# Patient Record
Sex: Female | Born: 1955 | Race: White | Hispanic: No | Smoking: Former smoker
Health system: Southern US, Community
[De-identification: ages and names within clinical notes are randomized; demographics above are authoritative.]

## PROBLEM LIST (undated history)

## (undated) DIAGNOSIS — I639 Cerebral infarction, unspecified: Secondary | ICD-10-CM

## (undated) HISTORY — DX: Cerebral infarction, unspecified: I63.9

## (undated) HISTORY — PX: BRAIN SURGERY: SHX531

---

## 1998-07-02 ENCOUNTER — Other Ambulatory Visit: Admission: RE | Admit: 1998-07-02 | Discharge: 1998-07-02 | Payer: Self-pay | Admitting: Gynecology

## 1999-08-18 ENCOUNTER — Other Ambulatory Visit: Admission: RE | Admit: 1999-08-18 | Discharge: 1999-08-18 | Payer: Self-pay | Admitting: Gynecology

## 1999-12-23 ENCOUNTER — Other Ambulatory Visit: Admission: RE | Admit: 1999-12-23 | Discharge: 1999-12-23 | Payer: Self-pay | Admitting: Gynecology

## 2000-08-31 ENCOUNTER — Other Ambulatory Visit: Admission: RE | Admit: 2000-08-31 | Discharge: 2000-08-31 | Payer: Self-pay | Admitting: Gynecology

## 2001-04-08 ENCOUNTER — Emergency Department (HOSPITAL_COMMUNITY): Admission: EM | Admit: 2001-04-08 | Discharge: 2001-04-08 | Payer: Self-pay | Admitting: Emergency Medicine

## 2001-09-06 ENCOUNTER — Other Ambulatory Visit: Admission: RE | Admit: 2001-09-06 | Discharge: 2001-09-06 | Payer: Self-pay | Admitting: Gynecology

## 2004-01-29 ENCOUNTER — Other Ambulatory Visit: Admission: RE | Admit: 2004-01-29 | Discharge: 2004-01-29 | Payer: Self-pay | Admitting: Gynecology

## 2005-05-14 ENCOUNTER — Other Ambulatory Visit: Admission: RE | Admit: 2005-05-14 | Discharge: 2005-05-14 | Payer: Self-pay | Admitting: Gynecology

## 2006-05-13 ENCOUNTER — Other Ambulatory Visit: Admission: RE | Admit: 2006-05-13 | Discharge: 2006-05-13 | Payer: Self-pay | Admitting: Gynecology

## 2012-08-24 ENCOUNTER — Other Ambulatory Visit: Payer: Self-pay | Admitting: Internal Medicine

## 2012-08-24 ENCOUNTER — Ambulatory Visit
Admission: RE | Admit: 2012-08-24 | Discharge: 2012-08-24 | Disposition: A | Discharge status: Home / Self Care | Payer: Medicare PPO | Source: Ambulatory Visit | Attending: Internal Medicine | Admitting: Internal Medicine

## 2012-08-24 DIAGNOSIS — W19XXXA Unspecified fall, initial encounter: Secondary | ICD-10-CM

## 2013-08-28 ENCOUNTER — Other Ambulatory Visit: Payer: Self-pay | Admitting: Internal Medicine

## 2013-08-28 ENCOUNTER — Ambulatory Visit
Admission: RE | Admit: 2013-08-28 | Discharge: 2013-08-28 | Disposition: A | Discharge status: Home / Self Care | Payer: Medicare PPO | Source: Ambulatory Visit | Attending: Internal Medicine | Admitting: Internal Medicine

## 2013-08-28 DIAGNOSIS — W19XXXA Unspecified fall, initial encounter: Secondary | ICD-10-CM

## 2014-10-02 ENCOUNTER — Ambulatory Visit
Admission: RE | Admit: 2014-10-02 | Discharge: 2014-10-02 | Disposition: A | Discharge status: Home / Self Care | Payer: Medicare PPO | Source: Ambulatory Visit | Attending: Internal Medicine | Admitting: Internal Medicine

## 2014-10-02 ENCOUNTER — Other Ambulatory Visit: Payer: Self-pay | Admitting: Internal Medicine

## 2014-10-02 DIAGNOSIS — R059 Cough, unspecified: Secondary | ICD-10-CM

## 2014-10-02 DIAGNOSIS — R05 Cough: Secondary | ICD-10-CM

## 2020-01-13 ENCOUNTER — Emergency Department (HOSPITAL_COMMUNITY): Payer: Medicare PPO

## 2020-01-13 ENCOUNTER — Emergency Department (HOSPITAL_COMMUNITY)
Admission: EM | Admit: 2020-01-13 | Discharge: 2020-01-13 | Disposition: A | Discharge status: Home / Self Care | Payer: Medicare PPO | Attending: Emergency Medicine | Admitting: Emergency Medicine

## 2020-01-13 ENCOUNTER — Other Ambulatory Visit: Payer: Self-pay

## 2020-01-13 DIAGNOSIS — S3210XA Unspecified fracture of sacrum, initial encounter for closed fracture: Secondary | ICD-10-CM | POA: Insufficient documentation

## 2020-01-13 DIAGNOSIS — Y92002 Bathroom of unspecified non-institutional (private) residence single-family (private) house as the place of occurrence of the external cause: Secondary | ICD-10-CM | POA: Diagnosis not present

## 2020-01-13 DIAGNOSIS — W19XXXA Unspecified fall, initial encounter: Secondary | ICD-10-CM

## 2020-01-13 DIAGNOSIS — Y999 Unspecified external cause status: Secondary | ICD-10-CM | POA: Insufficient documentation

## 2020-01-13 DIAGNOSIS — Y9389 Activity, other specified: Secondary | ICD-10-CM | POA: Diagnosis not present

## 2020-01-13 DIAGNOSIS — S32592A Other specified fracture of left pubis, initial encounter for closed fracture: Secondary | ICD-10-CM

## 2020-01-13 DIAGNOSIS — W010XXA Fall on same level from slipping, tripping and stumbling without subsequent striking against object, initial encounter: Secondary | ICD-10-CM | POA: Diagnosis not present

## 2020-01-13 DIAGNOSIS — I10 Essential (primary) hypertension: Secondary | ICD-10-CM | POA: Insufficient documentation

## 2020-01-13 DIAGNOSIS — S32502A Unspecified fracture of left pubis, initial encounter for closed fracture: Secondary | ICD-10-CM | POA: Insufficient documentation

## 2020-01-13 DIAGNOSIS — S79912A Unspecified injury of left hip, initial encounter: Secondary | ICD-10-CM | POA: Diagnosis present

## 2020-01-13 MED ORDER — OXYCODONE-ACETAMINOPHEN 5-325 MG PO TABS
1.0000 | ORAL_TABLET | Freq: Once | ORAL | Status: AC
  Administered 2020-01-13: 1 via ORAL
  Filled 2020-01-13: qty 1

## 2020-01-13 NOTE — ED Provider Notes (Signed)
[facility]-EMERGENCY DEPT Provider Note   CSN: [id number] Arrival date & time: 01/13/20  1230     History Chief Complaint  Patient presents with  . left hip injury    Samantha Duncan is a 64 y.o. female history of brain aneurysm 1989, residual left-sided weakness, hypertension, hyperlipidemia presents today after fall that occurred on Wednesday.  Patient was taking a shower when she slipped falling towards her right side.  She did not lose consciousness denies any head injury.  She reports that she had left hip pain since the fall despite falling onto her right side.  She was able to crawl out of the bathroom into her bed, she reports she was on the ground for around 10 minutes before she was able to pull herself up into bed.  She describes moderate-severe left hip and left mid thigh pain since the fall, constant throbbing nonradiating worsened with palpation improved with rest.  She has been taking hydrocodone with some relief of symptoms.  Denies head injury, loss consciousness, blood thinner use, neck pain, back pain, chest pain, abdominal pain, nausea/vomiting, pain to the other 3 extremities, dysuria/hematuria, recent illness, fever/chills or any additional concerns.  HPI     No past medical history on file.  There are no problems to display for this patient.      OB History   No obstetric history on file.     No family history on file.  Social History   Tobacco Use  . Smoking status: Not on file  Substance Use Topics  . Alcohol use: Not on file  . Drug use: Not on file    Home Medications Prior to Admission medications   Not on File    Allergies    Sulfa antibiotics  Review of Systems   Review of Systems Ten systems are reviewed and are negative for acute change except as noted in the HPI  Physical Exam Updated Vital Signs BP (!) 154/78 (BP Location: Left Arm)   Pulse (!) 103   Temp 98.5 F (36.9 C) (Oral)   Resp 17   Ht 5\' 1"   (1.549 m)   Wt 49.9 kg   SpO2 95%   BMI 20.78 kg/m   Physical Exam Constitutional:      General: She is not in acute distress.    Appearance: Normal appearance. She is well-developed. She is not ill-appearing or diaphoretic.  HENT:     Head: Normocephalic and atraumatic.  Eyes:     General: Vision grossly intact. Gaze aligned appropriately.     Pupils: Pupils are equal, round, and reactive to light.  Neck:     Trachea: Trachea and phonation normal.  Pulmonary:     Effort: Pulmonary effort is normal. No respiratory distress.  Abdominal:     General: There is no distension.     Palpations: Abdomen is soft.     Tenderness: There is no abdominal tenderness. There is no guarding or rebound.  Musculoskeletal:        General: Normal range of motion.     Cervical back: Normal range of motion.     Comments: No midline C/T/L spinal tenderness to palpation, no paraspinal muscle tenderness, no deformity, crepitus, or step-off noted. No sign of injury to the neck or back. - Left hip and upper thigh tenderness without overlying skin change.  All other major joints mobilized with verbal range of motion and strength for age and disability.  Skin:    General: Skin  is warm and dry.  Neurological:     Mental Status: She is alert.     GCS: GCS eye subscore is 4. GCS verbal subscore is 5. GCS motor subscore is 6.     Comments: Speech is clear and goal oriented, follows commands Major Cranial nerves without deficit, no facial droop Moves extremities without ataxia, coordination intact  Psychiatric:        Behavior: Behavior normal.     ED Results / Procedures / Treatments   Labs (all labs ordered are listed, but only abnormal results are displayed) Labs Reviewed - No data to display  EKG None  Radiology CT Hip Left Wo Contrast  Result Date: 01/13/2020 CLINICAL DATA:  Left hip pain since a fall 01/10/2020. Possible fracture on plain film of the left hip today. Initial encounter. EXAM:  CT OF THE LEFT HIP WITHOUT CONTRAST TECHNIQUE: Multidetector CT imaging of the left hip was performed according to the standard protocol. Multiplanar CT image reconstructions were also generated. COMPARISON:  Plain films left hip earlier today. FINDINGS: Bones/Joint/Cartilage The left hip is located. No hip fracture is identified. Nondisplaced left sacral fracture is identified. Also seen is a nondisplaced fracture of the left inferior pubic ramus. There is also likely a nondisplaced fracture of the high left superior pubic ramus although it is difficult to visualize. Minimal degenerative change is seen about the left hip. Ligaments Suboptimally assessed by CT. Muscles and Tendons Intact and normal in appearance. Soft tissues Imaged intrapelvic contents demonstrate no acute or focal abnormality. Extensive atherosclerosis noted. IMPRESSION: Negative for hip fracture. Nondisplaced left sacral and pubic rami fractures. Aortic Atherosclerosis (ICD10-I70.0). Electronically Signed   By: Drusilla Kanner M.D.   On: 01/13/2020 14:56   DG HIP UNILAT W OR W/O PELVIS 2-3 VIEWS LEFT  Result Date: 01/13/2020 CLINICAL DATA:  Fall on Wednesday D in the shower. The LEFT hip pain started yesterday. EXAM: DG HIP (WITH OR WITHOUT PELVIS) 2-3V LEFT COMPARISON:  Femur of the same date. FINDINGS: Osteopenia. Subtle abnormality at the margin of the LEFT femoral head seen only on the AP view which is in nonstandard positioning. There may be cortical step-off in this location. No additional signs of fracture. There is some nonstandard positioning on AP view perhaps related to neuro muscular limitations. IMPRESSION: Question subtle subcapital fracture on the LEFT. Suggest CT or MRI for further evaluation in this osteopenic patient. Electronically Signed   By: Donzetta Kohut M.D.   On: 01/13/2020 14:02   DG Femur 1 View Left  Result Date: 01/13/2020 CLINICAL DATA:  Fall in shower.  Left hip pain EXAM: LEFT FEMUR 1 VIEW COMPARISON:   None. FINDINGS: A single view was specifically requested as there is a dedicated hip series performed at the same time and reported separately. No visible femur fracture and no hip or knee dislocation in the frontal view. Osteopenic appearance IMPRESSION: 1. Single view left femur without acute finding. Please reference dedicated hip series. 2. Osteopenia. Electronically Signed   By: Marnee Spring M.D.   On: 01/13/2020 14:11    Procedures Procedures (including critical care time)  Medications Ordered in ED Medications  oxyCODONE-acetaminophen (PERCOCET/ROXICET) 5-325 MG per tablet 1 tablet (1 tablet Oral Given 01/13/20 1414)    ED Course  I have reviewed the triage vital signs and the nursing notes.  Pertinent labs & imaging results that were available during my care of the patient were reviewed by me and considered in my medical decision making (see chart for  details).    MDM Rules/Calculators/A&P                          Additional history obtained from: 1. Nursing notes from this visit. 2. Roommate, Ms. Cory Roughen. ------------------------------------ DG Pelvis w/ Left Hip:  IMPRESSION:  Question subtle subcapital fracture on the LEFT. Suggest CT or MRI  for further evaluation in this osteopenic patient.   DG Left Femur:  IMPRESSION:  1. Single view left femur without acute finding. Please reference  dedicated hip series.  2. Osteopenia.   CT Left Hip:  IMPRESSION:  Negative for hip fracture.    Nondisplaced left sacral and pubic rami fractures.    Aortic Atherosclerosis (ICD10-I70.0).  - Patient well-appearing no acute distress pleasant.  No evidence of head injury, neck injury, chest abdomen or back injury.  Pelvis is stable to compression bilaterally.  She has some pain with movement at the left hip.  Minimal pain at the left upper thigh suspect secondary to nondisplaced fracture seen on CT scan today.  No pain at the knee or remainder of the leg.  She is  neurovascular intact distally strong and equal pedal pulses, good capillary refill and sensation.  Patient has no evidence of injury to the other 3 extremities.  Her left-sided deficit is baseline since 1989 per patient.  She is fully alert and oriented and is not on any blood thinners.  Patient requested that her roommate and caregiver Lanney Gins be involved in discussion, shared decision making was made.  Patient and caregiver are both requesting discharge today, Ms. Cory Roughen will be able to help care for the patient at home and assist with transfers as patient cannot use a walker due to her left upper extremity paralysis.  They have asked that home health referral be given to assist during daytime hours when Ms. Cory Roughen may be unavailable.  They have been given referral to orthopedist for further evaluation and management.  No indication for admission or further work-up at this time.  Patient has hydrocodone she takes by her PCP, will not prescribe additional opioids at this time, patient will use OTC anti-inflammatories and rice therapy at home.  Patient denies any other concerns, she reports that she has been well taken care of by Ms. Cory Roughen at home, no decreased p.o. intake or infectious symptoms, suspect mild tachycardia on arrival was secondary to pain, improved with Percocet.  At this time there does not appear to be any evidence of an acute emergency medical condition and the patient appears stable for discharge with appropriate outpatient follow up. Diagnosis was discussed with patient who verbalizes understanding of care plan and is agreeable to discharge. I have discussed return precautions with patient and Ms Cory Roughen who verbalizes understanding. Patient encouraged to follow-up with their PCP and orthopedist. All questions answered.  Patient's case and imaging discussed with Dr. Denton Lank who agrees with plan to discharge with outpatient follow-up.   Note: Portions of this report may have been  transcribed using voice recognition software. Every effort was made to ensure accuracy; however, inadvertent computerized transcription errors may still be present. Final Clinical Impression(s) / ED Diagnoses Final diagnoses:  Fall, initial encounter  Closed fracture of sacrum, unspecified portion of sacrum, initial encounter (HCC)  Closed fracture of multiple pubic rami, left, initial encounter [employer])    Rx / DC Orders ED Discharge Orders         Ordered    Home Health  01/13/20 1546    Face-to-face encounter (required for Medicare/Medicaid patients)       Comments: I Bill Salinas certify that this patient is under my care and that I, or a nurse practitioner or physician's assistant working with me, had a face-to-face encounter that meets the physician face-to-face encounter requirements with this patient on 01/13/2020. The encounter with the patient was in whole, or in part for the following medical condition(s) which is the primary reason for home health care (List medical condition): Residual left-sided weakness from previous stroke.  Now with left rami and left sacral nondisplaced fractures.  Difficulty getting around at home.  Patient is roommate helps with care at night but patient needs help during the day with ADLs.   01/13/20 1546           Bill Salinas, PA-C 01/13/20 1558    Cathren Laine, MD 01/13/20 [phone]

## 2020-01-13 NOTE — ED Triage Notes (Signed)
Patient had fall on Wednesday in shower. Was able to walk after fall. Patient says hip now hurts 10/10 - unable to walk. Went to [facility] and was sent here for xrays.

## 2020-01-13 NOTE — Discharge Instructions (Addendum)
At this time there does not appear to be the presence of an emergent medical condition, however there is always the potential for conditions to change. Please read and follow the below instructions.  Please return to the Emergency Department immediately for any new or worsening symptoms. Please be sure to follow up with your Primary Care Provider within one week regarding your visit today; please call their office to schedule an appointment even if you are feeling better for a follow-up visit. Please call the orthopedic specialist Dr. Everardo Pacific on your discharge paperwork to schedule follow-up appointment regarding your fractures.  Please drink plenty of water and get plenty of rest.  In addition to your home hydrocodone you may take OTC anti-inflammatories such as Tylenol and ibuprofen to help with your pain.  Get help right away if you: Feel light-headed or faint. Develop chest pain. Develop shortness of breath. Have a fever. Have blood in your urine or your stools. Have bleeding in your vagina. Have difficulty or pain with urination or with passing stool. Have difficulty or increased pain with walking. Have new or increased swelling in one of your legs. Have numbness in your legs or groin area. You have any new/concerning or worsening of symptoms  Please read the additional information packets attached to your discharge summary.  Do not take your medicine if  develop an itchy rash, swelling in your mouth or lips, or difficulty breathing; call 911 and seek immediate emergency medical attention if this occurs.  You may review your lab tests and imaging results in their entirety on your MyChart account.  Please discuss all results of fully with your primary care provider and other specialist at your follow-up visit.  Note: Portions of this text may have been transcribed using voice recognition software. Every effort was made to ensure accuracy; however, inadvertent computerized transcription  errors may still be present.

## 2020-01-16 ENCOUNTER — Telehealth: Payer: Self-pay

## 2020-01-16 NOTE — Telephone Encounter (Signed)
Received a message from Ms Samantha Duncan that Samantha Duncan was attempting to get a hold of CM about his mother. She was a [facility] this past Saturday, and there were orders for Home health, but he was not been contacted about them. In looking through the chart, no CM or CSW notes were  In the chart.indicating that the consult had been seen. The son had initially wanted to have his mother placed in SNF, which this CM told him he would have to present her back to the ED, and then  [facility] and tlaked through process nmore and wants to try home ehalth. I spke directly to Dr. Su Hilt, who stated he did not think that home health would meet her needs, but was m=willing to sign home ehalth orders. Is socnerned about ramus fx with PT, but is worth having them evaluate her. dHe verbally stated that she needed PT, OT, Aide and social work. Called [facility] for services. And [facility] did accept although admitted struggles with Dr Su Hilt signing orders for [facility]. I assured him that MD stated he would sign orders.

## 2020-01-18 ENCOUNTER — Encounter (HOSPITAL_COMMUNITY): Payer: Self-pay | Admitting: Emergency Medicine

## 2020-01-18 DIAGNOSIS — R2689 Other abnormalities of gait and mobility: Secondary | ICD-10-CM | POA: Diagnosis not present

## 2020-01-18 DIAGNOSIS — Y999 Unspecified external cause status: Secondary | ICD-10-CM | POA: Insufficient documentation

## 2020-01-18 DIAGNOSIS — S79919A Unspecified injury of unspecified hip, initial encounter: Secondary | ICD-10-CM | POA: Diagnosis present

## 2020-01-18 DIAGNOSIS — Y9289 Other specified places as the place of occurrence of the external cause: Secondary | ICD-10-CM | POA: Diagnosis not present

## 2020-01-18 DIAGNOSIS — Y9389 Activity, other specified: Secondary | ICD-10-CM | POA: Diagnosis not present

## 2020-01-18 DIAGNOSIS — Z20822 Contact with and (suspected) exposure to covid-19: Secondary | ICD-10-CM | POA: Diagnosis not present

## 2020-01-18 DIAGNOSIS — W19XXXA Unspecified fall, initial encounter: Secondary | ICD-10-CM | POA: Insufficient documentation

## 2020-01-18 DIAGNOSIS — S79912A Unspecified injury of left hip, initial encounter: Secondary | ICD-10-CM | POA: Diagnosis not present

## 2020-01-18 DIAGNOSIS — R2681 Unsteadiness on feet: Secondary | ICD-10-CM | POA: Insufficient documentation

## 2020-01-18 DIAGNOSIS — M6281 Muscle weakness (generalized): Secondary | ICD-10-CM | POA: Diagnosis not present

## 2020-01-18 NOTE — ED Triage Notes (Signed)
Pt had a fall on 8/18 and was seen in the ED on 8/21. Pt states the pain from her fall has not resolved. C/o pelvic pain, foot pain, and left hip pain, and left ankle pain. Pt states she is unable to function on her own at home and feels she needs inpatient rehab. Family has been speaking with a case worker and they recommended her be seen in the ED for reevaluation. Pt currently taking home pain meds with so relief. Denies other complaints.

## 2020-01-19 ENCOUNTER — Emergency Department (HOSPITAL_COMMUNITY): Payer: Medicare PPO

## 2020-01-19 ENCOUNTER — Emergency Department (HOSPITAL_BASED_OUTPATIENT_CLINIC_OR_DEPARTMENT_OTHER): Payer: Medicare PPO

## 2020-01-19 ENCOUNTER — Emergency Department (HOSPITAL_COMMUNITY)
Admission: EM | Admit: 2020-01-19 | Discharge: 2020-01-22 | Disposition: A | Discharge status: Home / Self Care | Payer: Medicare PPO | Attending: Emergency Medicine | Admitting: Emergency Medicine

## 2020-01-19 DIAGNOSIS — R609 Edema, unspecified: Secondary | ICD-10-CM | POA: Diagnosis not present

## 2020-01-19 DIAGNOSIS — W19XXXA Unspecified fall, initial encounter: Secondary | ICD-10-CM

## 2020-01-19 LAB — COMPREHENSIVE METABOLIC PANEL
ALT: 19 U/L (ref 0–44)
AST: 18 U/L (ref 15–41)
Albumin: 3.9 g/dL (ref 3.5–5.0)
Alkaline Phosphatase: 66 U/L (ref 38–126)
Anion gap: 11 (ref 5–15)
BUN: 23 mg/dL (ref 8–23)
CO2: 25 mmol/L (ref 22–32)
Calcium: 9.1 mg/dL (ref 8.9–10.3)
Chloride: 102 mmol/L (ref 98–111)
Creatinine, Ser: 0.55 mg/dL (ref 0.44–1.00)
GFR calc Af Amer: >60 mL/min (ref >60)
GFR calc non Af Amer: >60 mL/min (ref >60)
Glucose, Bld: 131 mg/dL — ABNORMAL HIGH (ref 70–99)
Potassium: 4.2 mmol/L (ref 3.5–5.1)
Sodium: 138 mmol/L (ref 135–145)
Total Bilirubin: 0.9 mg/dL (ref 0.3–1.2)
Total Protein: 7.2 g/dL (ref 6.5–8.1)

## 2020-01-19 LAB — SARS CORONAVIRUS 2 BY RT PCR (HOSPITAL ORDER, PERFORMED IN ~~LOC~~ HOSPITAL LAB): SARS Coronavirus 2: NEGATIVE

## 2020-01-19 LAB — CBC WITH DIFFERENTIAL/PLATELET
Abs Immature Granulocytes: 0.04 10*3/uL (ref 0.00–0.07)
Basophils Absolute: 0.1 10*3/uL (ref 0.0–0.1)
Basophils Relative: 1 %
Eosinophils Absolute: 0.1 10*3/uL (ref 0.0–0.5)
Eosinophils Relative: 1 %
HCT: 43.8 % (ref 36.0–46.0)
Hemoglobin: 14.3 g/dL (ref 12.0–15.0)
Immature Granulocytes: 0 %
Lymphocytes Relative: 16 %
Lymphs Abs: 1.6 10*3/uL (ref 0.7–4.0)
MCH: 30.4 pg (ref 26.0–34.0)
MCHC: 32.6 g/dL (ref 30.0–36.0)
MCV: 93.2 fL (ref 80.0–100.0)
Monocytes Absolute: 0.8 10*3/uL (ref 0.1–1.0)
Monocytes Relative: 8 %
Neutro Abs: 7.5 10*3/uL (ref 1.7–7.7)
Neutrophils Relative %: 74 %
Platelets: 238 10*3/uL (ref 150–400)
RBC: 4.7 MIL/uL (ref 3.87–5.11)
RDW: 12.5 % (ref 11.5–15.5)
WBC: 10.1 10*3/uL (ref 4.0–10.5)
nRBC: 0 % (ref 0.0–0.2)

## 2020-01-19 MED ORDER — TRAZODONE HCL 100 MG PO TABS
100.0000 mg | ORAL_TABLET | Freq: Every day | ORAL | Status: DC
  Administered 2020-01-19 – 2020-01-21 (×3): 100 mg via ORAL
  Filled 2020-01-19 (×3): qty 1

## 2020-01-19 MED ORDER — AMLODIPINE BESYLATE 5 MG PO TABS
10.0000 mg | ORAL_TABLET | Freq: Every day | ORAL | Status: DC
  Administered 2020-01-19 – 2020-01-21 (×3): 10 mg via ORAL
  Filled 2020-01-19 (×3): qty 2

## 2020-01-19 MED ORDER — ROSUVASTATIN CALCIUM 10 MG PO TABS
10.0000 mg | ORAL_TABLET | ORAL | Status: DC
  Administered 2020-01-22: 10 mg via ORAL
  Filled 2020-01-19: qty 1

## 2020-01-19 MED ORDER — LOSARTAN POTASSIUM 50 MG PO TABS
50.0000 mg | ORAL_TABLET | Freq: Every day | ORAL | Status: DC
  Administered 2020-01-19 – 2020-01-21 (×3): 50 mg via ORAL
  Filled 2020-01-19 (×4): qty 1

## 2020-01-19 MED ORDER — VITAMIN D3 25 MCG (1000 UNIT) PO TABS
1000.0000 [IU] | ORAL_TABLET | Freq: Every day | ORAL | Status: DC
  Administered 2020-01-19 – 2020-01-22 (×4): 1000 [IU] via ORAL
  Filled 2020-01-19 (×6): qty 1

## 2020-01-19 MED ORDER — DOCUSATE SODIUM 100 MG PO CAPS
100.0000 mg | ORAL_CAPSULE | Freq: Every day | ORAL | Status: DC
  Administered 2020-01-19 – 2020-01-22 (×4): 100 mg via ORAL
  Filled 2020-01-19 (×4): qty 1

## 2020-01-19 MED ORDER — CAL/MAG 200-100 MG PO TABS: 1.0000 | ORAL_TABLET | Freq: Every day | ORAL | Status: DC

## 2020-01-19 MED ORDER — ADULT MULTIVITAMIN W/MINERALS CH
1.0000 | ORAL_TABLET | Freq: Every day | ORAL | Status: DC
  Administered 2020-01-19 – 2020-01-22 (×4): 1 via ORAL
  Filled 2020-01-19 (×4): qty 1

## 2020-01-19 MED ORDER — CLORAZEPATE DIPOTASSIUM 3.75 MG PO TABS
7.5000 mg | ORAL_TABLET | Freq: Two times a day (BID) | ORAL | Status: DC
  Administered 2020-01-19: 7.5 mg via ORAL
  Filled 2020-01-19: qty 1
  Filled 2020-01-19 (×2): qty 2

## 2020-01-19 MED ORDER — COENZYME Q10 30 MG PO CAPS: 30.0000 mg | ORAL_CAPSULE | Freq: Every day | ORAL | Status: DC

## 2020-01-19 MED ORDER — HYDROCODONE-ACETAMINOPHEN 5-325 MG PO TABS
1.0000 | ORAL_TABLET | Freq: Three times a day (TID) | ORAL | Status: DC | PRN
  Administered 2020-01-19 – 2020-01-22 (×8): 1 via ORAL
  Filled 2020-01-19 (×7): qty 1

## 2020-01-19 MED ORDER — HYDROCODONE-ACETAMINOPHEN 5-325 MG PO TABS
1.0000 | ORAL_TABLET | Freq: Once | ORAL | Status: DC
  Filled 2020-01-19: qty 1

## 2020-01-19 MED ORDER — TIZANIDINE HCL 4 MG PO TABS
4.0000 mg | ORAL_TABLET | Freq: Two times a day (BID) | ORAL | Status: DC
  Administered 2020-01-19 – 2020-01-22 (×7): 4 mg via ORAL
  Filled 2020-01-19 (×7): qty 1

## 2020-01-19 MED ORDER — CLORAZEPATE DIPOTASSIUM 3.75 MG PO TABS
7.5000 mg | ORAL_TABLET | Freq: Two times a day (BID) | ORAL | Status: DC
  Administered 2020-01-20 – 2020-01-22 (×4): 7.5 mg via ORAL
  Filled 2020-01-19 (×6): qty 2

## 2020-01-19 NOTE — Progress Notes (Signed)
PHARMACIST - PHYSICIAN ORDER COMMUNICATION  CONCERNING: P&T Medication Policy on Herbal Medications  DESCRIPTION:  This patient's order for: CoQ10 and Cal/Mag otc combo  has been noted.  This product(s) is classified as an "herbal" or natural product. Due to a lack of definitive safety studies or FDA approval, nonstandard manufacturing practices, plus the potential risk of unknown drug-drug interactions while on inpatient medications, the Pharmacy and Therapeutics Committee does not permit the use of "herbal" or natural products of this type within [facility].   ACTION TAKEN: The pharmacy department is unable to verify this order at this time. Please reevaluate patient's clinical condition at discharge and address if the herbal or natural product(s) should be resumed at that time.

## 2020-01-19 NOTE — Social Work (Signed)
TOC CSW has started pts authorization with [employer].    Reference #:  [id number]  CSW started authorization with [facility], has not yet been accepted but is the first choice of pt and family.  Family's choices as follow:  1) [facility]  2) [facility]  3) [facility]  4) [facility]  5) [facility]   CSW will continue to follow for dc needs.  Raneen Jaffer Tarpley-Carter, MSW, LCSW-A                  [facility] ED Transitions of Care Clinical Social Worker [email or web address] [phone]

## 2020-01-19 NOTE — ED Provider Notes (Signed)
[facility]-EMERGENCY DEPT Provider Note   CSN: [id number] Arrival date & time: 01/18/20  1732     History Chief Complaint  Patient presents with  . Fall    Samantha Duncan is a 64 y.o. female.  Patient presents to the emergency department with a chief complaint of hip pain.  She has known pelvis fractures from recent mechanical fall.  She has persistent left-sided deficits from remote stroke.  She lives at home with her roommate.  She has been trying to care for herself at home, but reports that she is unable to transfer and has had difficulty fulfilling her ADLs based on her new hip fractures.  She is requesting evaluation and placement into a rehab center.  Additionally, she complains of a left foot pain.  She states that she may have injured this during her fall a little over week ago, but she is uncertain.  She notes swelling to her foot.  She states that she does have a known remote fracture of her toe.  She denies history of PE or DVT.  She denies being anticoagulated.  The history is provided by the patient. No language interpreter was used.       Past Medical History:  Diagnosis Date  . Stroke [facility])     There are no problems to display for this patient.   History reviewed. No pertinent surgical history.   OB History   No obstetric history on file.     No family history on file.  Social History   Tobacco Use  . Smoking status: Not on file  Substance Use Topics  . Alcohol use: Not on file  . Drug use: Not on file    Home Medications Prior to Admission medications   Not on File    Allergies    Sulfa antibiotics  Review of Systems   Review of Systems  All other systems reviewed and are negative.   Physical Exam Updated Vital Signs BP (!) 138/107 (BP Location: Right Arm)   Pulse 80   Temp 98.9 F (37.2 C)   Resp 16   Ht 5\' 1"  (1.549 m)   Wt 49.9 kg   SpO2 95%   BMI 20.78 kg/m   Physical Exam Vitals and nursing note  reviewed.  Constitutional:      General: She is not in acute distress.    Appearance: She is well-developed.  HENT:     Head: Normocephalic and atraumatic.  Eyes:     Conjunctiva/sclera: Conjunctivae normal.  Cardiovascular:     Rate and Rhythm: Normal rate and regular rhythm.     Heart sounds: No murmur heard.   Pulmonary:     Effort: Pulmonary effort is normal. No respiratory distress.     Breath sounds: Normal breath sounds.  Abdominal:     Palpations: Abdomen is soft.     Tenderness: There is no abdominal tenderness.  Musculoskeletal:        General: Normal range of motion.     Cervical back: Neck supple.     Comments: Left foot swelling No deformity  Skin:    General: Skin is warm and dry.  Neurological:     Mental Status: She is alert and oriented to person, place, and time.     Comments: Left sided upper and lower deficits (baseline)  Psychiatric:        Mood and Affect: Mood normal.        Behavior: Behavior normal.  ED Results / Procedures / Treatments   Labs (all labs ordered are listed, but only abnormal results are displayed) Labs Reviewed  SARS CORONAVIRUS 2 BY RT PCR (HOSPITAL ORDER, PERFORMED IN [facility])  CBC WITH DIFFERENTIAL/PLATELET  COMPREHENSIVE METABOLIC PANEL    EKG None  Radiology DG Foot Complete Left  Result Date: 01/19/2020 CLINICAL DATA:  64 year old female with left foot pain and swelling. EXAM: LEFT FOOT - COMPLETE 3+ VIEW COMPARISON:  None. FINDINGS: There is no acute fracture or dislocation. There is advanced osteopenia. There is diffuse subcutaneous edema of the dorsum of the foot which may represent cellulitis. No soft tissue gas. There is a 6 mm linear radiopaque density in the soft tissues along the medial aspect of the distal phalanx of the second digit. Clinical correlation is recommended. IMPRESSION: 1. No acute fracture or dislocation. 2. Diffuse subcutaneous edema of the dorsum of the foot may represent  cellulitis. 3. A 6 mm linear radiopaque density in the soft tissues of the second digit, likely a foreign object. Electronically Signed   By: Elgie Collard M.D.   On: 01/19/2020 02:31    Procedures Procedures (including critical care time)  Medications Ordered in ED Medications - No data to display  ED Course  I have reviewed the triage vital signs and the nursing notes.  Pertinent labs & imaging results that were available during my care of the patient were reviewed by me and considered in my medical decision making (see chart for details).    MDM Rules/Calculators/A&P                          This patient complains of hip pain, this involves an extensive number of treatment options, and is a complaint that carries with it a high risk of complications and morbidity.    Pertinent Labs I ordered, reviewed, and interpreted labs, which included CBC shows no leukocytosis.  Electrolytes are normal.  Covid negative.  Imaging Interpretation I ordered imaging studies which included left foot.  I independently visualized and interpreted the left foot, which showed soft tissue swelling and FB near second digit.    There is no evidence of puncture wound. There is not any TTP about the left digit.  There is swelling, but no excessive warmth and no redness.  I think complication from FB unlikely, physical exam is not concerning for cellulities.  Will proceed with DVT study in the AM. .   Consultants Social work and case management.  Plan Signed out to oncoming team at shift change.  Pending: 1. L LE DVT study.  If negative recommend PCP follow-up.  No evidence of infection/cellulitis on exam. 2. Case Management/Social Work consult for placement in rehab center due to pubic rami fractures, inability to perform ADLs, left sided deficits from old stroke.     Final Clinical Impression(s) / ED Diagnoses Final diagnoses:  None    Rx / DC Orders ED Discharge Orders    None        Roxy Horseman, PA-C 01/19/20 Ok Edwards, MD 01/19/20 [phone]

## 2020-01-19 NOTE — Progress Notes (Signed)
Lower extremity venous has been completed.   Preliminary results in CV Proc.   Blanch Media 01/19/2020 9:19 AM

## 2020-01-19 NOTE — Evaluation (Signed)
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  She had a fall on 01/10/20 resulting in pelvis fracture and returned home but having difficulty managing at home.  Pt presented to ED with continued pain, difficulty with ADLs/mobility, and L ankle/foot pain and swelling.  L LE was negative for DVT and negative for  foot fx.  Clinical Impression  Pt admitted with pelvis fracture and painful L foot.  Pt fell over a week ago and has been having difficulty managing at home.  She has hx of L sided weakness from prior CVA that has increased her difficulty in recovery from pelvis fracture.  She reports exacerbation of L sided tone, weakness, and clonus due to pain from pelvis.  She is unable to weight bear on L LE.  Pt unable to use walker for pressure relief due to contracted L UE.  Additionally, pt with pain and edema in L ankle - imaging negative, symptoms consistent with sprain.  Pt's roommate is frail and unable to provide support.  Pt unsafe to return home from PT perspective.  Recommend SNF at d/c.  Pt currently with functional limitations due to the deficits listed below (see PT Problem List). Pt will benefit from skilled PT to increase their independence and safety with mobility to allow discharge to the venue listed below.       Follow Up Recommendations SNF    Equipment Recommendations  Wheelchair cushion (measurements PT);Wheelchair (measurements PT) (currently borrowing a w/c that is too large)    Recommendations for Other Services       Precautions / Restrictions Precautions Precautions: Fall Restrictions Weight Bearing Restrictions: No Other Position/Activity Restrictions: WBAT - L pelvis fx      Mobility  Bed Mobility Overal bed mobility: Needs Assistance Bed Mobility: Supine to  Sit;Sit to Supine     Supine to sit: Mod assist Sit to supine: Mod assist   General bed mobility comments: assist for legs and trunk;limited by pain  Transfers Overall transfer level: Needs assistance Equipment used: 1 person hand held assist Transfers: Sit to/from Stand;Stand Pivot Transfers Sit to Stand: Min assist Stand pivot transfers: Mod assist       General transfer comment: Stood with use of gait belt and pt holding therapist arm with her R arm, most of weight on R leg; pivoted toward HOB with mod A and cues  Ambulation/Gait             General Gait Details: pivoted on R LE-see above  Stairs            Wheelchair Mobility    Modified Rankin (Stroke Patients Only)       Balance Overall balance assessment: Needs assistance Sitting-balance support: No upper extremity supported Sitting balance-Leahy Scale: Fair Sitting balance - Comments: static -steady; dynamice used R UE for balance   Standing balance support: Single extremity supported Standing balance-Leahy Scale: Poor Standing balance comment: required R UE and support from tehrapist                             Pertinent Vitals/Pain Pain Assessment: 0-10 Pain Score: 9  Pain Location: L hip and foot Pain Descriptors / Indicators: Discomfort;Sore;Aching Pain Intervention(s): Limited activity within patient's tolerance;Monitored during session;Repositioned  Home Living Family/patient expects to be discharged to:: Private residence Living Arrangements: Alone Available Help at Discharge: Available PRN/intermittently Type of Home: House (townhouse) Home Access: Stairs to enter   [employer] of Steps: threshold small Home Layout: One level Home Equipment: Cane - single point;Grab bars - tub/shower;Shower seat - built in;Shower seat Additional Comments: borrowed w/c    Prior Function Level of Independence: Needs assistance   Gait / Transfers Assistance Needed: Short  community distances with cane; in home without cane  ADL's / Homemaking Assistance Needed: Independent with ADLs and light IADLs; has housekeeper and roommate to assist with higher level task  Comments: Drives; 1 fall in last 6 months (other falls in past); Since fall last week pt has been unable to stand or bear weight.     Hand Dominance        Extremity/Trunk Assessment   Upper Extremity Assessment Upper Extremity Assessment: RUE deficits/detail;LUE deficits/detail RUE Deficits / Details: overall WFL LUE Deficits / Details: Chronic flexion contractures    Lower Extremity Assessment Lower Extremity Assessment: LLE deficits/detail;RLE deficits/detail RLE Deficits / Details: ROM WFL; MMT 4/5 limited by pelvis pain  LLE Deficits / Details:      ROM: ankle tends to PF and pronate, knee tight lacking ~20 degrees ext, hip WFL, pt reports ROM is less than R         side but normally better than current level, feels that tone is exacerbated by pain;       MMT: ankle 0/5 normally wears AFO but swollen, knee 1/5, hip 1/5 ; reports normally 3/5 knee and hip;      + clonus in ankle baseline but exacerbated now;      Pitting edema Foot and ankle      LLE Sensation: decreased light touch    Cervical / Trunk Assessment Cervical / Trunk Assessment: Normal  Communication   Communication: No difficulties  Cognition Arousal/Alertness: Awake/alert Behavior During Therapy: WFL for tasks assessed/performed Overall Cognitive Status: Impaired/Different from baseline                                 General Comments: Overall WFL - but reports feels "foggy" from pain and pain meds; son reports taking longer to respond and having difficulty focusing      General Comments General comments (skin integrity, edema, etc.): Pt typically wears AFO on L ankle but is unable due to edema.  Reports feels that tone is increased due to pain and is Plantar flexing and pronating more.  Would  benefit from lace up ankle splint to provide support and assist wtih positioning until able to wear AFO.  Notified ortho tech.  Did place ACE wrap currently to assist with edema management.    Exercises     Assessment/Plan    PT Assessment Patient needs continued PT services  PT Problem List Decreased strength;Decreased mobility;Decreased safety awareness;Decreased range of motion;Decreased activity tolerance;Decreased balance;Decreased knowledge of use of DME;Pain       PT Treatment Interventions DME instruction;Therapeutic activities;Modalities;Gait training;Therapeutic exercise;Patient/family education;Balance training;Functional mobility training    PT Goals (Current goals can be found in the Care Plan section)  Acute Rehab PT Goals Patient Stated Goal: rehab to get stronger PT Goal Formulation: With patient/family Time For Goal Achievement: 02/02/20 Potential to Achieve Goals: Good    Frequency Min 3X/week   Barriers to discharge Decreased caregiver support      Co-evaluation  AM-PAC PT "6 Clicks" Mobility  Outcome Measure Help needed turning from your back to your side while in a flat bed without using bedrails?: A Lot Help needed moving from lying on your back to sitting on the side of a flat bed without using bedrails?: A Lot Help needed moving to and from a bed to a chair (including a wheelchair)?: A Lot Help needed standing up from a chair using your arms (e.g., wheelchair or bedside chair)?: A Lot Help needed to walk in hospital room?: Total Help needed climbing 3-5 steps with a railing? : Total 6 Click Score: 10    End of Session Equipment Utilized During Treatment: Gait belt Activity Tolerance: Patient tolerated treatment well Patient left: in bed;with call bell/phone within reach;with family/visitor present Nurse Communication: Mobility status PT Visit Diagnosis: Other abnormalities of gait and mobility (R26.89);Muscle weakness  (generalized) (M62.81);Unsteadiness on feet (R26.81);Pain Pain - Right/Left: Left Pain - part of body: Leg;Ankle and joints of foot    Time: 1610-9604 PT Time Calculation (min) (ACUTE ONLY): 42 min   Charges:   PT Evaluation $PT Eval Moderate Complexity: 1 Mod PT Treatments $Therapeutic Activity: 8-22 mins        Samantha Duncan, PT Acute Rehab Services Pager [phone] [facility] Rehab [phone]    Samantha Duncan 01/19/2020, 12:05 PM

## 2020-01-19 NOTE — ED Provider Notes (Signed)
Pt is a 64 y.o. female that is boarding in the emergency department and currently waiting on a social work evaluation.  Patient has known pelvis fracture from a recent mechanical fall as well as persistent left-sided deficits from prior CVA.  She lives at home with a roommate.  She is having more difficulty with her ADLs.  Care was transferred to me at shift change.  She has been swelling in her left foot which she believes was secondary to an injury from her fall last week but is unsure.  There was a foreign body noted along the second digit of the left foot.  Both myself as well as the previous PA-C did not see any signs of entrance wound or signs of cellulitis or infection.  Labs today are reassuring.  Patient is afebrile.  Not tachycardic.  No leukocytosis.  Due to the ambiguity of her foot swelling a DVT study was obtained which was negative in the left lower extremity.  Patient is medically cleared, her home medications have been ordered, and food is been ordered.  She will board in the emergency department pending social work consult.    Placido Sou, PA-C 01/19/20 1123    Tegeler, Canary Brim, MD 01/19/20 [phone]

## 2020-01-19 NOTE — Social Work (Signed)
TOC CSW consult request has been received. CSW attempting to follow up at present time.   CSW will continue to follow for dc needs.  Mccauley Diehl Tarpley-Carter, MSW, LCSW-A                  [facility] ED Transitions of CareClinical Social Worker [email or web address] [phone] 

## 2020-01-20 NOTE — NC FL2 (Addendum)
[place] MEDICAID FL2 LEVEL OF CARE SCREENING TOOL     IDENTIFICATION  Patient Name: Samantha Duncan Birthdate: 1955/07/21 Sex: female Admission Date (Current Location): 01/19/2020  [facility] and [place] Number:  Producer, television/film/video and Address:  [facility],  [address] [place] [address]      Provider Number: [id number]  Attending Physician Name and Address:  Default, Provider, MD  Relative Name and Phone Number:       Current Level of Care: Hospital Recommended Level of Care: Skilled Nursing Facility Prior Approval Number:    Date Approved/Denied: 01/20/20 PASRR Number: [id number]  Discharge Plan: SNF    Current Diagnoses: There are no problems to display for this patient.   Orientation RESPIRATION BLADDER Height & Weight     Self, Time, Situation, Place  Normal Continent Weight: 110 lb (49.9 kg) Height:  5\' 1"  (154.9 cm)  BEHAVIORAL SYMPTOMS/MOOD NEUROLOGICAL BOWEL NUTRITION STATUS      Incontinent    AMBULATORY STATUS COMMUNICATION OF NEEDS Skin   Extensive Assist Verbally Normal                       Personal Care Assistance Level of Assistance  Dressing, Feeding, Bathing Bathing Assistance: Limited assistance Feeding assistance: Independent Dressing Assistance: Limited assistance     Functional Limitations Info  Sight, Hearing, Speech Sight Info: Adequate Hearing Info: Adequate Speech Info: Adequate    SPECIAL CARE FACTORS FREQUENCY  PT (By licensed PT), OT (By licensed OT)     PT Frequency: 5x weekly OT Frequency: 5x weekly            Contractures Contractures Info: Not present    Additional Factors Info  Allergies   Allergies Info: Sulfa antibiotics           Current Medications (01/20/2020):  This is the current hospital active medication list Current Facility-Administered Medications  Medication Dose Route Frequency Provider Last Rate Last Admin  . amLODipine (NORVASC) tablet 10 mg  10 mg Oral QHS  Joldersma, Logan, PA-C   10 mg at 01/19/20 2258  . cholecalciferol (VITAMIN D) tablet 1,000 Units  1,000 Units Oral Daily 2259, PA-C   1,000 Units at 01/20/20 1015  . clorazepate (TRANXENE) tablet 7.5 mg  7.5 mg Oral BID 01/22/20, PA-C   7.5 mg at 01/20/20 1245  . docusate sodium (COLACE) capsule 100 mg  100 mg Oral Daily Joldersma, Logan, PA-C   100 mg at 01/20/20 1015  . HYDROcodone-acetaminophen (NORCO/VICODIN) 5-325 MG per tablet 1 tablet  1 tablet Oral TID PRN 01/22/20, PA-C   1 tablet at 01/20/20 0817  . losartan (COZAAR) tablet 50 mg  50 mg Oral QHS Joldersma, Logan, PA-C   50 mg at 01/19/20 2258  . multivitamin with minerals tablet 1 tablet  1 tablet Oral Daily 2259, PA-C   1 tablet at 01/20/20 1015  . [START ON 01/22/2020] rosuvastatin (CRESTOR) tablet 10 mg  10 mg Oral Once per day on Mon Thu Joldersma, Logan, PA-C      . tiZANidine (ZANAFLEX) tablet 4 mg  4 mg Oral BID 07-14-1997, PA-C   4 mg at 01/20/20 1015  . traZODone (DESYREL) tablet 100 mg  100 mg Oral QHS 01/22/20, PA-C   100 mg at 01/19/20 2258   Current Outpatient Medications  Medication Sig Dispense Refill  . amLODipine (NORVASC) 10 MG tablet Take 10 mg by mouth at bedtime.     2259  Biotin w/ Vitamins C & E (HAIR/SKIN/NAILS PO) Take 1 tablet by mouth daily.    . Calcium-Magnesium (CAL/MAG) 200-100 MG TABS Take 1 tablet by mouth daily.    . Cholecalciferol (VITAMIN D3 PO) Take 1 tablet by mouth daily.    . clorazepate (TRANXENE) 7.5 MG tablet Take 7.5 mg by mouth 2 (two) times daily.    Marland Kitchen co-enzyme Q-10 30 MG capsule Take 30 mg by mouth daily.    Marland Kitchen HYDROcodone-acetaminophen (NORCO/VICODIN) 5-325 MG tablet Take 1 tablet by mouth 3 (three) times daily as needed for moderate pain.     Marland Kitchen losartan (COZAAR) 50 MG tablet Take 50 mg by mouth at bedtime.     . Multiple Vitamin (MULTIVITAMIN WITH MINERALS) TABS tablet Take 1 tablet by mouth daily.    Marland Kitchen OVER THE COUNTER MEDICATION Take  1 tablet by mouth daily. Healthy Legs (circulation)    . rosuvastatin (CRESTOR) 10 MG tablet Take 10 mg by mouth 2 (two) times a week. TUE & THUR    . tiZANidine (ZANAFLEX) 4 MG tablet Take 4 mg by mouth 2 (two) times daily.    . traZODone (DESYREL) 100 MG tablet Take 100 mg by mouth at bedtime.        Discharge Medications: Please see discharge summary for a list of discharge medications.  Relevant Imaging Results:  Relevant Lab Results:   Additional Information SSN: [id number]  Inis Sizer, LCSW

## 2020-01-20 NOTE — Care Management (Addendum)
First acceptance by  [facility] care They will have a bed Monday. Will need another COVID test.  Within 72 hours of placement  Texted to [facility] about acceptance and from list, the fact that [facility] is off the table due to you have to buy in to them ahead of time. What is left is [facility] and [facility]  On the choices.

## 2020-01-20 NOTE — Progress Notes (Signed)
CSW completed FL2, PASSR screening, and faxed patient's clinical information out for review to the preferred facilities - in addition to other facilities in [place].  Edwin Dada, MSW, LCSW-A Transitions of Care  Clinical Social Worker  [facility] Emergency Departments  Medical ICU [phone]

## 2020-01-21 NOTE — Progress Notes (Addendum)
CSW spoke with Judeth Cornfield, RN CM who confirmed with the acceptance of the bed offer from [facility].   CSW notified Tresa Endo at [facility] of acceptance and authorization information.  CSW updated the insurance authorization request from [facility] to reflect the correct facility - auth was approved to begin on 01/22/20.  Patient will discharge to [facility] tomorrow morning - CSW will leave handoff for 1st shift [facility] ED CSW to facilitate discharge tomorrow.  Edwin Dada, MSW, LCSW-A Transitions of Care  Clinical Social Worker  [facility] Emergency Departments  Medical ICU [phone]

## 2020-01-21 NOTE — TOC Progression Note (Signed)
Transition of Care [facility]) - Progression Note    Patient Details  Name: Samantha Duncan MRN: [id number] Date of Birth: [birth year]  Transition of Care [facility]) CM/SW Contact  Lockie Pares, RN Phone Number: 01/21/2020, 2:03 PM  Clinical Narrative:    Son Thayer Ohm notified of probable placement tomorrow. Gave him the number to [facility] healthcare so he could ask questions about visitation. Etc.        Expected Discharge Plan and Services                                                 Social Determinants of Health (SDOH) Interventions    Readmission Risk Interventions No flowsheet data found.

## 2020-01-22 ENCOUNTER — Other Ambulatory Visit: Payer: Self-pay

## 2020-01-22 NOTE — ED Notes (Signed)
Report given to Darrius, Charity fundraiser at [facility] care

## 2020-01-22 NOTE — Progress Notes (Signed)
TOC CSW has been in contact with [facility] [phone].  Pt is ready to be transported to [facility] by [facility] [phone], option 1, then option 3.  Patient will be in room 118P, and call report to [place] at [phone].  AVS has been faxed.  Please reconsult if new social work issues arise, CSW signing off.  Zanylah Hardie Tarpley-Carter, MSW, LCSW-A                  [facility] ED Transitions of CareClinical Social Worker [email or web address] [phone]

## 2020-01-22 NOTE — ED Notes (Signed)
[facility] called to transport patient  

## 2020-01-22 NOTE — ED Notes (Signed)
Attempted to call report at [facility] health care, unable to reach RN. Secretary given # to call back when RN is available

## 2020-01-22 NOTE — Progress Notes (Signed)
TOC CSW spoke with Chris/pts son, [phone].  We spoke about his comfort with SNF placement and the process of having pt transported.  Pt will be going to [facility].    CSW will reach out to [facility] to confirm acceptance and complete transport.  CSW will continue to follow for dc needs.  Allison Deshotels Tarpley-Carter, MSW, LCSW-A                  [facility] ED Transitions of CareClinical Social Worker [email or web address] [phone]

## 2020-01-30 ENCOUNTER — Emergency Department (HOSPITAL_COMMUNITY)
Admission: EM | Admit: 2020-01-30 | Discharge: 2020-01-30 | Disposition: A | Discharge status: Skilled Nursing Facility | Payer: Medicare PPO | Attending: Emergency Medicine | Admitting: Emergency Medicine

## 2020-01-30 ENCOUNTER — Other Ambulatory Visit: Payer: Self-pay

## 2020-01-30 ENCOUNTER — Encounter (HOSPITAL_COMMUNITY): Payer: Self-pay

## 2020-01-30 ENCOUNTER — Emergency Department (HOSPITAL_COMMUNITY): Payer: Medicare PPO

## 2020-01-30 DIAGNOSIS — Z87891 Personal history of nicotine dependence: Secondary | ICD-10-CM | POA: Insufficient documentation

## 2020-01-30 DIAGNOSIS — Y92002 Bathroom of unspecified non-institutional (private) residence single-family (private) house as the place of occurrence of the external cause: Secondary | ICD-10-CM | POA: Diagnosis not present

## 2020-01-30 DIAGNOSIS — W182XXA Fall in (into) shower or empty bathtub, initial encounter: Secondary | ICD-10-CM | POA: Diagnosis not present

## 2020-01-30 DIAGNOSIS — Y998 Other external cause status: Secondary | ICD-10-CM | POA: Diagnosis not present

## 2020-01-30 DIAGNOSIS — S9002XA Contusion of left ankle, initial encounter: Secondary | ICD-10-CM | POA: Insufficient documentation

## 2020-01-30 DIAGNOSIS — S90212A Contusion of left great toe with damage to nail, initial encounter: Secondary | ICD-10-CM | POA: Insufficient documentation

## 2020-01-30 DIAGNOSIS — T148XXA Other injury of unspecified body region, initial encounter: Secondary | ICD-10-CM

## 2020-01-30 DIAGNOSIS — Y9389 Activity, other specified: Secondary | ICD-10-CM | POA: Insufficient documentation

## 2020-01-30 DIAGNOSIS — S99912A Unspecified injury of left ankle, initial encounter: Secondary | ICD-10-CM | POA: Diagnosis present

## 2020-01-30 DIAGNOSIS — Z79899 Other long term (current) drug therapy: Secondary | ICD-10-CM | POA: Diagnosis not present

## 2020-01-30 DIAGNOSIS — Z20822 Contact with and (suspected) exposure to covid-19: Secondary | ICD-10-CM | POA: Insufficient documentation

## 2020-01-30 LAB — COMPREHENSIVE METABOLIC PANEL
ALT: 20 U/L (ref 0–44)
AST: 24 U/L (ref 15–41)
Albumin: 4.1 g/dL (ref 3.5–5.0)
Alkaline Phosphatase: 147 U/L — ABNORMAL HIGH (ref 38–126)
Anion gap: 11 (ref 5–15)
BUN: 17 mg/dL (ref 8–23)
CO2: 27 mmol/L (ref 22–32)
Calcium: 9.1 mg/dL (ref 8.9–10.3)
Chloride: 103 mmol/L (ref 98–111)
Creatinine, Ser: 0.51 mg/dL (ref 0.44–1.00)
GFR calc Af Amer: >60 mL/min (ref >60)
GFR calc non Af Amer: >60 mL/min (ref >60)
Glucose, Bld: 142 mg/dL — ABNORMAL HIGH (ref 70–99)
Potassium: 4.4 mmol/L (ref 3.5–5.1)
Sodium: 141 mmol/L (ref 135–145)
Total Bilirubin: 0.4 mg/dL (ref 0.3–1.2)
Total Protein: 7.6 g/dL (ref 6.5–8.1)

## 2020-01-30 LAB — CBC WITH DIFFERENTIAL/PLATELET
Abs Immature Granulocytes: 0.03 10*3/uL (ref 0.00–0.07)
Basophils Absolute: 0.1 10*3/uL (ref 0.0–0.1)
Basophils Relative: 1 %
Eosinophils Absolute: 0.1 10*3/uL (ref 0.0–0.5)
Eosinophils Relative: 1 %
HCT: 52.1 % — ABNORMAL HIGH (ref 36.0–46.0)
Hemoglobin: 16.9 g/dL — ABNORMAL HIGH (ref 12.0–15.0)
Immature Granulocytes: 0 %
Lymphocytes Relative: 17 %
Lymphs Abs: 1.7 10*3/uL (ref 0.7–4.0)
MCH: 30.2 pg (ref 26.0–34.0)
MCHC: 32.4 g/dL (ref 30.0–36.0)
MCV: 93.2 fL (ref 80.0–100.0)
Monocytes Absolute: 0.6 10*3/uL (ref 0.1–1.0)
Monocytes Relative: 6 %
Neutro Abs: 7.8 10*3/uL — ABNORMAL HIGH (ref 1.7–7.7)
Neutrophils Relative %: 75 %
Platelets: 319 10*3/uL (ref 150–400)
RBC: 5.59 MIL/uL — ABNORMAL HIGH (ref 3.87–5.11)
RDW: 12.5 % (ref 11.5–15.5)
WBC: 10.4 10*3/uL (ref 4.0–10.5)
nRBC: 0 % (ref 0.0–0.2)

## 2020-01-30 LAB — CK: Total CK: 55 U/L (ref 38–234)

## 2020-01-30 LAB — SARS CORONAVIRUS 2 BY RT PCR (HOSPITAL ORDER, PERFORMED IN ~~LOC~~ HOSPITAL LAB): SARS Coronavirus 2: NEGATIVE

## 2020-01-30 LAB — LACTIC ACID, PLASMA: Lactic Acid, Venous: 1.9 mmol/L (ref 0.5–1.9)

## 2020-01-30 MED ORDER — SILVER SULFADIAZINE 1 % EX CREA: TOPICAL_CREAM | Freq: Once | CUTANEOUS | Status: DC

## 2020-01-30 MED ORDER — ONDANSETRON HCL 4 MG/2ML IJ SOLN: 4.0000 mg | Freq: Once | INTRAMUSCULAR | Status: DC

## 2020-01-30 MED ORDER — VANCOMYCIN HCL IN DEXTROSE 1-5 GM/200ML-% IV SOLN
1000.0000 mg | Freq: Once | INTRAVENOUS | Status: AC
  Administered 2020-01-30: 1000 mg via INTRAVENOUS
  Filled 2020-01-30: qty 200

## 2020-01-30 MED ORDER — BACITRACIN 500 UNIT/GM EX OINT
1.0000 "application " | TOPICAL_OINTMENT | Freq: Two times a day (BID) | CUTANEOUS | Status: DC
  Administered 2020-01-30: 1 via TOPICAL
  Filled 2020-01-30 (×2): qty 14

## 2020-01-30 MED ORDER — ONDANSETRON HCL 4 MG/2ML IJ SOLN
4.0000 mg | Freq: Four times a day (QID) | INTRAMUSCULAR | Status: DC | PRN
  Administered 2020-01-30: 4 mg via INTRAVENOUS
  Filled 2020-01-30: qty 2

## 2020-01-30 MED ORDER — FENTANYL CITRATE (PF) 100 MCG/2ML IJ SOLN
50.0000 ug | INTRAMUSCULAR | Status: DC | PRN
  Administered 2020-01-30: 50 ug via INTRAVENOUS
  Filled 2020-01-30: qty 2

## 2020-01-30 MED ORDER — CEPHALEXIN 500 MG PO CAPS: 500.0000 mg | ORAL_CAPSULE | Freq: Two times a day (BID) | ORAL | 0 refills | Status: AC

## 2020-01-30 NOTE — Progress Notes (Signed)
TOC CSW confirmed with [facility], [phone].  Geraldine Contras stated they would accept pt back once dc'd.  CSW will continue to follow for dc needs.  Scottie Metayer Tarpley-Carter, MSW, LCSW-A                  [facility] ED Transitions of CareClinical Social Worker [email or web address] [phone]

## 2020-01-30 NOTE — ED Triage Notes (Signed)
Pt came from [facility] facility via EMS for L foot swelling and wound infection. Pt post CVA and hip fracture

## 2020-01-30 NOTE — ED Notes (Signed)
[facility] notified of need of transport back to facility.

## 2020-01-30 NOTE — ED Provider Notes (Signed)
[facility]-EMERGENCY DEPT Provider Note   CSN: [id number] Arrival date & time: 01/30/20  1228     History Chief Complaint  Patient presents with  . Wound Infection    Samantha Duncan is a 64 y.o. female who presents for evaluation of left foot and ankle wound and swelling.  She has a past medical history of stroke with chronic left hemiparesis. On 821 the patient slipped and fell in the shower.  She suffered a nondisplaced sacral and pubic ramus fracture.  On 827 she returned to the emergency department because she was having significant difficulty caring for herself at home and had fallen again.  She had noted some swelling in the left foot, had a DVT scan which was negative and was placed in skilled nursing facility at [facility].  The patient arrives via EMS today because of concern for infection of the left foot.  Her nurse had noticed a dark area on the left heel.  She had been caring for it had been off for 3 days and when she returned today noticed that her foot was looking much worse.  She was sent in for evaluation.  The patient states that the foot has been very painful.  She denies fever, chills. HPI     Past Medical History:  Diagnosis Date  . Stroke [facility])     There are no problems to display for this patient.   History reviewed. No pertinent surgical history.   OB History   No obstetric history on file.     History reviewed. No pertinent family history.  Social History   Tobacco Use  . Smoking status: Former Smoker    Quit date: 01/10/2020    Years since quitting: 0.0  . Smokeless tobacco: Never Used  Vaping Use  . Vaping Use: Never used  Substance Use Topics  . Alcohol use: Not Currently  . Drug use: Not Currently    Home Medications Prior to Admission medications   Medication Sig Start Date End Date Taking? Authorizing Provider  amLODipine (NORVASC) 10 MG tablet Take 10 mg by mouth at bedtime.  12/12/19   [provider]  Biotin w/ Vitamins C & E (HAIR/SKIN/NAILS PO) Take 1 tablet by mouth daily.    [provider]  Calcium-Magnesium (CAL/MAG) 200-100 MG TABS Take 1 tablet by mouth daily.    [provider]  Cholecalciferol (VITAMIN D3 PO) Take 1 tablet by mouth daily.    [provider]  clorazepate (TRANXENE) 7.5 MG tablet Take 7.5 mg by mouth 2 (two) times daily. 12/20/19   [provider]  co-enzyme Q-10 30 MG capsule Take 30 mg by mouth daily.    [provider]  HYDROcodone-acetaminophen (NORCO/VICODIN) 5-325 MG tablet Take 1 tablet by mouth 3 (three) times daily as needed for moderate pain.  12/25/19   [provider]  losartan (COZAAR) 50 MG tablet Take 50 mg by mouth at bedtime.  01/02/20   [provider]  Multiple Vitamin (MULTIVITAMIN WITH MINERALS) TABS tablet Take 1 tablet by mouth daily.    [provider]  OVER THE COUNTER MEDICATION Take 1 tablet by mouth daily. Healthy Legs (circulation)    [provider]  rosuvastatin (CRESTOR) 10 MG tablet Take 10 mg by mouth 2 (two) times a week. TUE & THUR 12/13/19   [provider]  tiZANidine (ZANAFLEX) 4 MG tablet Take 4 mg by mouth 2 (two) times daily. 01/02/20   [provider]  traZODone (DESYREL) 100 MG tablet Take 100 mg by mouth at bedtime.  12/13/19   [provider]    Allergies    Sulfa antibiotics  Review of Systems   Review of Systems Ten systems reviewed and are negative for acute change, except as noted in the HPI.   Physical Exam Updated Vital Signs BP 139/71 (BP Location: Right Arm)   Pulse 78   Temp 98.2 F (36.8 C) (Oral)   Resp 18   Ht 5\' 1"  (1.549 m)   Wt 49.9 kg   SpO2 99%   BMI 20.78 kg/m   Physical Exam Vitals and nursing note reviewed.  Constitutional:      General: She is not in acute distress.    Appearance: She is well-developed. She is not diaphoretic.  HENT:     Head: Normocephalic and  atraumatic.  Eyes:     General: No scleral icterus.    Conjunctiva/sclera: Conjunctivae normal.  Cardiovascular:     Rate and Rhythm: Normal rate and regular rhythm.     Pulses:          Dorsalis pedis pulses are 2+ on the right side and 2+ on the left side.       Posterior tibial pulses are 2+ on the right side and 2+ on the left side.     Heart sounds: Normal heart sounds. No murmur heard.  No friction rub. No gallop.   Pulmonary:     Effort: Pulmonary effort is normal. No respiratory distress.     Breath sounds: Normal breath sounds.  Abdominal:     General: Bowel sounds are normal. There is no distension.     Palpations: Abdomen is soft. There is no mass.     Tenderness: There is no abdominal tenderness. There is no guarding.  Musculoskeletal:     Cervical back: Normal range of motion.     Comments: Left ankle with area of bruising, bullous formation, tenderness and erythema.  There is a deep, black area under the corneum lucidum of the left heel.  There is a subungual hematoma of the left great toe.  Skin:    General: Skin is warm and dry.  Neurological:     Mental Status: She is alert and oriented to person, place, and time.  Psychiatric:        Behavior: Behavior normal.               ED Results / Procedures / Treatments   Labs (all labs ordered are listed, but only abnormal results are displayed) Labs Reviewed  CULTURE, BLOOD (ROUTINE X 2)  CULTURE, BLOOD (ROUTINE X 2)  COMPREHENSIVE METABOLIC PANEL  CBC WITH DIFFERENTIAL/PLATELET  LACTIC ACID, PLASMA    EKG None  Radiology No results found.  Procedures Procedures (including critical care time)  Medications Ordered in ED Medications - No data to display  ED Course  I have reviewed the triage vital signs and the nursing notes.  Pertinent labs & imaging results that were available during my care of the patient were reviewed by me and considered in my medical decision making (see chart for  details).    MDM Rules/Calculators/A&P                          Patient here with wound of the left foot.  I believe that this wound was caused by compression to the left foot after having an Ace wrap applied for PT evaluation  and left on for nearly 1 week.  Differential diagnosis includes , osteomyelitis, reperfusion injury, cellulitis.  I ordered interpreted and reviewed labs which include CBC which shows no elevated white blood cell count, CMP which shows mildly elevated blood glucose of insignificant value.  CK within normal limits.  Blood cultures were drawn and pending.  Covid test is negative.  I ordered and reviewed images of the left ankle which show no acute abnormalities including no osseous erosion.  I have extremely low suspicion for osteomyelitis.  Patient may have a component of underlying infection and will treat as a cellulitis.  She received IV vancomycin here.  I saw the patient and shared visit with Dr. Effie Shy who agrees that this is likely injury to the soft tissue from compression and should be treated like a burn.  I also reviewed images with Dr. Ramond Marrow who was in agreement.  Patient will have application of Neosporin ointment and gentle Kerlix dressing.  I have given explicit instructions for outpatient follow-up and management within the skilled nursing facility.  I discussed the case with the patient and her son on the phone and answered all questions to the best of my ability.  The patient appears otherwise appropriate for discharge at this time   Samantha Duncan was evaluated in Emergency Department on 01/30/2020 for the symptoms described in the history of present illness. She was evaluated in the context of the global COVID-19 pandemic, which necessitated consideration that the patient might be at risk for infection with the SARS-CoV-2 virus that causes COVID-19. Institutional protocols and algorithms that pertain to the evaluation of patients at risk for COVID-19 are in a  state of rapid change based on information released by regulatory bodies including the CDC and federal and state organizations. These policies and algorithms were followed during the patient's care in the ED.  Final Clinical Impression(s) / ED Diagnoses Final diagnoses:  None    Rx / DC Orders ED Discharge Orders    None       Arthor Captain, PA-C 01/30/20 1623    Mancel Bale, MD 01/30/20 [phone]

## 2020-01-30 NOTE — ED Notes (Signed)
Report attempt at [facility] x2.

## 2020-01-30 NOTE — ED Notes (Signed)
Attempted report to [facility] center x1

## 2020-01-30 NOTE — Discharge Instructions (Signed)
To the Facility: This patient's wound is likely secondary to  a compression injury from her ace wrap and needs to be treated like a burn. 1) She will need wound management with a skilled wound care specialist 2) she will need to follow up with either Dr. Mikey Bussing or with the [facility] wound care center  3) she will need daily evaluation of the wound with application of bacitracin ointment. 4) she will need some kind of pressure relief on the foot ( maybe a boot for heal wounds or propping up the leg so that there is NO pressure on the heal.)

## 2020-01-30 NOTE — ED Provider Notes (Signed)
  Face-to-face evaluation   History: She is here for evaluation of swelling and possible infection in left foot.  Physical exam: Elderly female, alert and cooperative.  Moderate swelling left ankle and left foot.  Dorsal left ankle region with blistering,, which has the appearance of full-thickness pressure damage; blistering and redness is present.  No frank discharge.  Ischemic appearing skin changes of os calcis region great toe.  Stable great toe subungual hematoma which appears subacute.  Medical screening examination/treatment/procedure(s) were conducted as a shared visit with non-physician practitioner(s) and myself.  I personally evaluated the patient during the encounter    Mancel Bale, MD 01/30/20 1810

## 2020-01-30 NOTE — Progress Notes (Signed)
A consult was received from an ED physician for Vancomycine per pharmacy dosing.  The patient's profile has been reviewed for ht/wt/allergies/indication/available labs.   A one time order has been placed for Vancomycin 1g IV.  Further antibiotics/pharmacy consults should be ordered by admitting physician if indicated.                       Thank you, Lynann Beaver PharmD, BCPS Clinical Pharmacist [facility] main pharmacy [phone] 01/30/2020 1:32 PM

## 2020-02-04 LAB — CULTURE, BLOOD (ROUTINE X 2): Culture: NO GROWTH

## 2020-02-29 ENCOUNTER — Other Ambulatory Visit: Payer: Self-pay

## 2020-02-29 ENCOUNTER — Encounter (HOSPITAL_BASED_OUTPATIENT_CLINIC_OR_DEPARTMENT_OTHER): Payer: Medicare PPO | Attending: Internal Medicine | Admitting: Internal Medicine

## 2020-02-29 DIAGNOSIS — L97528 Non-pressure chronic ulcer of other part of left foot with other specified severity: Secondary | ICD-10-CM | POA: Insufficient documentation

## 2020-02-29 DIAGNOSIS — I10 Essential (primary) hypertension: Secondary | ICD-10-CM | POA: Diagnosis not present

## 2020-02-29 DIAGNOSIS — L89620 Pressure ulcer of left heel, unstageable: Secondary | ICD-10-CM | POA: Insufficient documentation

## 2020-02-29 DIAGNOSIS — M21372 Foot drop, left foot: Secondary | ICD-10-CM | POA: Diagnosis not present

## 2020-02-29 NOTE — Progress Notes (Signed)
Samantha Duncan, Samantha Duncan ([id number]) Visit Report for 02/29/2020 Abuse/Suicide Risk Screen Details Patient Name: Date of Service: Samantha Duncan, Samantha Duncan 02/29/2020 2:45 PM Medical Record Number: [id number] Patient Account Number: [id number] Date of Birth/Sex: Treating RN: July 17, 1955 (64 y.o. Harvest [facility] Primary Care Trevionne Advani: Antony Madura Other Clinician: Referring Baneen Wieseler: Treating Normon Pettijohn/Extender: Louie Casa Weeks in Treatment: 0 Abuse/Suicide Risk Screen Items Answer ABUSE RISK SCREEN: Has anyone close to you tried to hurt or harm you recentlyo No Do you feel uncomfortable with anyone in your familyo No Has anyone forced you do things that you didnt want to doo No Electronic Signature(s) Signed: 02/29/2020 5:54:48 PM By: Cherylin Mylar Entered By: Cherylin Mylar on 02/29/2020 15:31:35 -------------------------------------------------------------------------------- Activities of Daily Living Details Patient Name: Date of Service: Samantha Duncan, Samantha Duncan 02/29/2020 2:45 PM Medical Record Number: [id number] Patient Account Number: [id number] Date of Birth/Sex: Treating RN: 1955/06/22 (64 y.o. Harvest Dark Primary Care Haedyn Breau: Antony Madura Other Clinician: Referring Delaina Fetsch: Treating Hajime Asfaw/Extender: Louie Casa Weeks in Treatment: 0 Activities of Daily Living Items Answer Activities of Daily Living (Please select one for each item) Drive Automobile Not Able T Medications ake Completely Able Use T elephone Completely Able Care for Appearance Need Assistance Use T oilet Need Assistance Bath / Shower Need Assistance Dress Self Need Assistance Feed Self Completely Able Walk Need Assistance Get In / Out Bed Need Assistance Housework Not Able Prepare Meals Not Able Handle Money Completely Able Shop for Self Not Able Electronic Signature(s) Signed: 02/29/2020 5:54:48 PM By: Cherylin Mylar Entered By:  Cherylin Mylar on 02/29/2020 15:32:14 -------------------------------------------------------------------------------- Education Screening Details Patient Name: Date of Service: Samantha Duncan 02/29/2020 2:45 PM Medical Record Number: [id number] Patient Account Number: [id number] Date of Birth/Sex: Treating RN: 04-Dec-1955 (64 y.o. Harvest Dark Primary Care Kateryn Marasigan: Antony Madura Other Clinician: Referring Quinetta Shilling: Treating Lizandro Spellman/Extender: Aileen Fass in Treatment: 0 Primary Learner Assessed: Patient Learning Preferences/Education Level/Primary Language Learning Preference: Explanation Preferred Language: English Cognitive Barrier Language Barrier: No Translator Needed: No Memory Deficit: No Emotional Barrier: No Cultural/Religious Beliefs Affecting Medical Care: No Physical Barrier Impaired Vision: No Impaired Hearing: No Decreased Hand dexterity: No Knowledge/Comprehension Knowledge Level: High Comprehension Level: High Ability to understand written instructions: High Ability to understand verbal instructions: High Motivation Anxiety Level: Calm Cooperation: Cooperative Education Importance: Acknowledges Need Interest in Health Problems: Asks Questions Perception: Coherent Willingness to Engage in Self-Management High Activities: Readiness to Engage in Self-Management High Activities: Electronic Signature(s) Signed: 02/29/2020 5:54:48 PM By: Cherylin Mylar Entered By: Cherylin Mylar on 02/29/2020 15:32:35 -------------------------------------------------------------------------------- Fall Risk Assessment Details Patient Name: Date of Service: Samantha Duncan 02/29/2020 2:45 PM Medical Record Number: [id number] Patient Account Number: [id number] Date of Birth/Sex: Treating RN: 02-Nov-1955 (64 y.o. Harvest Dark Primary Care Shandale Malak: Antony Madura Other Clinician: Referring  Kacin Dancy: Treating Tkai Large/Extender: Louie Casa Weeks in Treatment: 0 Fall Risk Assessment Items Have you had 2 or more falls in the last 12 monthso 0 Yes Have you had any fall that resulted in injury in the last 12 monthso 0 Yes FALLS RISK SCREEN History of falling - immediate or within 3 months 25 Yes Secondary diagnosis (Do you have 2 or more medical diagnoseso) 15 Yes Ambulatory aid None/bed rest/wheelchair/nurse 0 Yes Crutches/cane/walker 0 No Furniture 0 No Intravenous therapy Access/Saline/Heparin Lock 0 No Gait/Transferring Normal/ bed rest/ wheelchair 0 No Weak (short steps with or without shuffle, stooped but able  to lift head while walking, may seek 10 Yes support from furniture) Impaired (short steps with shuffle, may have difficulty arising from chair, head down, impaired 0 No balance) Mental Status Oriented to own ability 0 Yes Electronic Signature(s) Signed: 02/29/2020 5:54:48 PM By: Cherylin Mylar Entered By: Cherylin Mylar on 02/29/2020 15:36:46 -------------------------------------------------------------------------------- Foot Assessment Details Patient Name: Date of Service: Samantha Duncan. 02/29/2020 2:45 PM Medical Record Number: [id number] Patient Account Number: [id number] Date of Birth/Sex: Treating RN: June 15, 1955 (64 y.o. Har[employer] Dark Primary Care Montrae Braithwaite: Antony Madura Other Clinician: Referring Maddex Garlitz: Treating Jachin Coury/Extender: Louie Casa Weeks in Treatment: 0 Foot Assessment Items Site Locations + = Sensation present, - = Sensation absent, C = Callus, U = Ulcer R = Redness, W = Warmth, M = Maceration, PU = Pre-ulcerative lesion F = Fissure, S = Swelling, D = Dryness Assessment Right: Left: Other Deformity: No No Prior Foot Ulcer: No No Prior Amputation: No No Charcot Joint: No No Ambulatory Status: Ambulatory With Help Assistance Device: Wheelchair Gait:  Academic librarian Signature(s) Signed: 02/29/2020 5:54:48 PM By: Cherylin Mylar Entered By: Cherylin Mylar on 02/29/2020 15:33:36 -------------------------------------------------------------------------------- Nutrition Risk Screening Details Patient Name: Date of Service: Samantha Duncan, Samantha Duncan 02/29/2020 2:45 PM Medical Record Number: [id number] Patient Account Number: [id number] Date of Birth/Sex: Treating RN: 12/07/1955 (64 y.o. Har[employer] Dark Primary Care Abhiram Criado: Antony Madura Other Clinician: Referring Lailani Tool: Treating Vonceil Upshur/Extender: Louie Casa Weeks in Treatment: 0 Height (in): 61 Weight (lbs): 105 Body Mass Index (BMI): 19.8 Nutrition Risk Screening Items Score Screening NUTRITION RISK SCREEN: I have an illness or condition that made me change the kind and/or amount of food I eat 0 No I eat fewer than two meals per day 0 No I eat few fruits and vegetables, or milk products 0 No I have three or more drinks of beer, liquor or wine almost every day 0 No I have tooth or mouth problems that make it hard for me to eat 0 No I don't always have enough money to buy the food I need 0 No I eat alone most of the time 0 No I take three or more different prescribed or over-the-counter drugs a day 1 Yes Without wanting to, I have lost or gained 10 pounds in the last six months 0 No I am not always physically able to shop, cook and/or feed myself 2 Yes Nutrition Protocols Good Risk Protocol Moderate Risk Protocol 0 Provide education on nutrition High Risk Proctocol Risk Level: Moderate Risk Score: 3 Electronic Signature(s) Signed: 02/29/2020 5:54:48 PM By: Cherylin Mylar Entered By: Cherylin Mylar on 02/29/2020 15:33:24

## 2020-03-01 NOTE — Progress Notes (Signed)
Samantha Duncan, Samantha Duncan ([id number]) Visit Report for 02/29/2020 Allergy List Details Patient Name: Date of Service: Samantha Duncan, Samantha Duncan 02/29/2020 2:45 PM Medical Record Number: [id number] Patient Account Number: [id number] Date of Birth/Sex: Treating RN: 08-28-1955 (64 y.o. Samantha Duncan Primary Care Bridgid Printz: Floyde Parkins Other Clinician: Referring Elajah Kunsman: Treating Rosela Supak/Extender: Larene Pickett Weeks in Treatment: 0 Allergies Active Allergies Sulfa (Sulfonamide Antibiotics) Allergy Notes Electronic Signature(s) Signed: 02/29/2020 5:54:48 PM By: Kela Millin Entered By: Kela Millin on 02/29/2020 15:26:26 -------------------------------------------------------------------------------- Arrival Information Details Patient Name: Date of Service: Samantha Duncan 02/29/2020 2:45 PM Medical Record Number: [id number] Patient Account Number: [id number] Date of Birth/Sex: Treating RN: February 05, 1956 (64 y.o. Samantha Duncan Primary Care Sheli Dorin: Floyde Parkins Other Clinician: Referring Cambelle Suchecki: Treating Kamille Toomey/Extender: Lorin Glass in Treatment: 0 Visit Information Patient Arrived: Wheel Chair Arrival Time: 15:15 Accompanied By: son Transfer Assistance: Manual Patient Identification Verified: Yes Secondary Verification Process Completed: Yes Electronic Signature(s) Signed: 02/29/2020 5:54:48 PM By: Kela Millin Entered By: Kela Millin on 02/29/2020 15:30:39 -------------------------------------------------------------------------------- Clinic Level of Care Assessment Details Patient Name: Date of Service: Samantha Duncan, Samantha Duncan 02/29/2020 2:45 PM Medical Record Number: [id number] Patient Account Number: [id number] Date of Birth/Sex: Treating RN: [birth year] (64 y.o. Samantha Duncan Primary Care Juandaniel Manfredo: Floyde Parkins Other Clinician: Referring Davette Nugent: Treating Reyana Leisey/Extender: Larene Pickett Weeks in Treatment: 0 Clinic Level of Care Assessment Items TOOL 1 Quantity Score X- 1 0 Use when EandM and Procedure is performed on INITIAL visit ASSESSMENTS - Nursing Assessment / Reassessment X- 1 20 General Physical Exam (combine w/ comprehensive assessment (listed just below) when performed on new pt. evals) X- 1 25 Comprehensive Assessment (HX, ROS, Risk Assessments, Wounds Hx, etc.) ASSESSMENTS - Wound and Skin Assessment / Reassessment X- 1 10 Dermatologic / Skin Assessment (not related to wound area) ASSESSMENTS - Ostomy and/or Continence Assessment and Care '[]'  - 0 Incontinence Assessment and Management '[]'  - 0 Ostomy Care Assessment and Management (repouching, etc.) PROCESS - Coordination of Care '[]'  - 0 Simple Patient / Family Education for ongoing care X- 1 20 Complex (extensive) Patient / Family Education for ongoing care X- 1 10 Staff obtains Programmer, systems, Records, T Results / Process Orders est X- 1 10 Staff telephones HHA, Nursing Homes / Clarify orders / etc '[]'  - 0 Routine Transfer to another Facility (non-emergent condition) '[]'  - 0 Routine Hospital Admission (non-emergent condition) X- 1 15 New Admissions / Biomedical engineer / Ordering NPWT Apligraf, etc. , '[]'  - 0 Emergency Hospital Admission (emergent condition) PROCESS - Special Needs '[]'  - 0 Pediatric / Minor Patient Management '[]'  - 0 Isolation Patient Management '[]'  - 0 Hearing / Language / Visual special needs '[]'  - 0 Assessment of Community assistance (transportation, D/C planning, etc.) '[]'  - 0 Additional assistance / Altered mentation '[]'  - 0 Support Surface(s) Assessment (bed, cushion, seat, etc.) INTERVENTIONS - Miscellaneous '[]'  - 0 External ear exam '[]'  - 0 Patient Transfer (multiple staff / Civil Service fast streamer / Similar devices) '[]'  - 0 Simple Staple / Suture removal (25 or less) '[]'  - 0 Complex Staple / Suture removal (26 or more) '[]'  - 0 Hypo/Hyperglycemic  Management (do not check if billed separately) X- 1 15 Ankle / Brachial Index (ABI) - do not check if billed separately Has the patient been seen at the hospital within the last three years: Yes Total Score: 125 Level Of Care: New/Established - Level 4 Electronic Signature(s) Signed: 02/29/2020 6:22:31 PM  By: Deon Pilling Entered By: Deon Pilling on 02/29/2020 16:27:05 -------------------------------------------------------------------------------- Encounter Discharge Information Details Patient Name: Date of Service: Samantha Duncan 02/29/2020 2:45 PM Medical Record Number: [id number] Patient Account Number: [id number] Date of Birth/Sex: Treating RN: 22-Feb-1956 (64 y.o. Samantha Duncan Primary Care Elner Seifert: Floyde Parkins Other Clinician: Referring Allyne Hebert: Treating Cailie Bosshart/Extender: Larene Pickett Weeks in Treatment: 0 Encounter Discharge Information Items Post Procedure Vitals Discharge Condition: Stable Temperature (F): 98.6 Ambulatory Status: Wheelchair Pulse (bpm): 72 Discharge Destination: Home Respiratory Rate (breaths/min): 18 Transportation: Private Auto Blood Pressure (mmHg): 155/71 Accompanied By: son Schedule Follow-up Appointment: Yes Clinical Summary of Care: Patient Declined Electronic Signature(s) Signed: 02/29/2020 4:57:30 PM By: Mikeal Hawthorne EMT/HBOT/SD Entered By: Mikeal Hawthorne on 02/29/2020 16:57:14 -------------------------------------------------------------------------------- Lower Extremity Assessment Details Patient Name: Date of Service: Samantha Duncan 02/29/2020 2:45 PM Medical Record Number: [id number] Patient Account Number: [id number] Date of Birth/Sex: Treating RN: Aug 05, 1955 (64 y.o. Samantha Duncan Primary Care Georgeanne Frankland: Floyde Parkins Other Clinician: Referring Mikle Sternberg: Treating Shahzad Thomann/Extender: Larene Pickett Weeks in Treatment: 0 Edema Assessment Assessed: Shirlyn Goltz:  No] Patrice Paradise: No] Edema: [Left: Ye] [Right: s] Calf Left: Right: Point of Measurement: From Medial Instep 25 cm Ankle Left: Right: Point of Measurement: From Medial Instep 21 cm Vascular Assessment Pulses: Dorsalis Pedis Palpable: [Left:No] Blood Pressure: Brachial: [Left:155] Ankle: [Left:Dorsalis Pedis: 142 0.92] Electronic Signature(s) Signed: 02/29/2020 5:54:48 PM By: Kela Millin Entered By: Kela Millin on 02/29/2020 15:53:03 -------------------------------------------------------------------------------- Multi Wound Chart Details Patient Name: Date of Service: Samantha Duncan. 02/29/2020 2:45 PM Medical Record Number: [id number] Patient Account Number: [id number] Date of Birth/Sex: Treating RN: Oct 30, 1955 (64 y.o. Samantha Duncan, Tammi Klippel Primary Care Chrissi Crow: Floyde Parkins Other Clinician: Referring Montre Harbor: Treating Mirayah Wren/Extender: Larene Pickett Weeks in Treatment: 0 Vital Signs Height(in): 7 Pulse(bpm): 29 Weight(lbs): 105 Blood Pressure(mmHg): 155/71 Body Mass Index(BMI): 20 Temperature(F): 98.6 Respiratory Rate(breaths/min): 18 Photos: [1:No Photos Left Calcaneus] [2:No Photos Left, Anterior Ankle] [N/A:N/A N/A] Wound Location: [1:Blister] [2:Blister] [N/A:N/A] Wounding Event: [1:Pressure Ulcer] [2:Pressure Ulcer] [N/A:N/A] Primary Etiology: [1:Hypertension] [2:Hypertension] [N/A:N/A] Comorbid History: [1:01/10/2020] [2:01/10/2020] [N/A:N/A] Date Acquired: [1:0] [2:0] [N/A:N/A] Weeks of Treatment: [1:Open] [2:Open] [N/A:N/A] Wound Status: [1:3.4x3.1x0.1] [2:0.3x0.4x0.1] [N/A:N/A] Measurements L x W x D (cm) [1:8.278] [2:0.094] [N/A:N/A] A (cm) : rea [1:0.828] [2:0.009] [N/A:N/A] Volume (cm) : [1:Unstageable/Unclassified] [2:Category/Stage III] [N/A:N/A] Classification: [1:Medium] [2:Medium] [N/A:N/A] Exudate A mount: [1:Serous] [2:Serous] [N/A:N/A] Exudate Type: [1:amber] [2:amber] [N/A:N/A] Exudate Color:  [1:Distinct, outline attached] [2:Distinct, outline attached] [N/A:N/A] Wound Margin: [1:Small (1-33%)] [2:Medium (34-66%)] [N/A:N/A] Granulation A mount: [1:Pink] [2:Pink] [N/A:N/A] Granulation Quality: [1:Large (67-100%)] [2:Medium (34-66%)] [N/A:N/A] Necrotic A mount: [1:Eschar, Adherent Slough] [2:Adherent Slough] [N/A:N/A] Necrotic Tissue: [1:Fat Layer (Subcutaneous Tissue): Yes Fat Layer (Subcutaneous Tissue): Yes N/A] Exposed Structures: [1:Fascia: No Tendon: No Muscle: No Joint: No Bone: No None] [2:Fascia: No Tendon: No Muscle: No Joint: No Bone: No None] [N/A:N/A] Epithelialization: [1:Debridement - Excisional] [2:N/A] [N/A:N/A] Debridement: Pre-procedure Verification/Time Out 16:05 [2:N/A] [N/A:N/A] Taken: [1:Lidocaine 4% Topical Solution] [2:N/A] [N/A:N/A] Pain Control: [1:Necrotic/Eschar, Subcutaneous,] [2:N/A] [N/A:N/A] Tissue Debrided: [1:Slough Skin/Subcutaneous Tissue] [2:N/A] [N/A:N/A] Level: [1:10.54] [2:N/A] [N/A:N/A] Debridement A (sq cm): [1:rea Curette] [2:N/A] [N/A:N/A] Instrument: [1:Minimum] [2:N/A] [N/A:N/A] Bleeding: [1:Pressure] [2:N/A] [N/A:N/A] Hemostasis Achieved: [1:0] [2:N/A] [N/A:N/A] Procedural Pain: [1:2] [2:N/A] [N/A:N/A] Post Procedural Pain: Debridement Treatment Response: Procedure was tolerated well [2:N/A] [N/A:N/A] Post Debridement Measurements L x 3.4x3.1x0.3 [2:N/A] [N/A:N/A] W x D (cm) [1:2.483] [2:N/A] [N/A:N/A] Post Debridement Volume: (cm) [1:Unstageable/Unclassified] [2:N/A] [N/A:N/A] Post Debridement Stage: [1:Debridement] [2:N/A] [N/A:N/A]  Treatment Notes Wound #1 (Left Calcaneus) 3. Primary Dressing Applied Iodoflex 4. Secondary Dressing Dry Gauze Roll Gauze Heel Cup 5. Secured With Tape Notes netting / globoped offloading Duncan Wound #2 (Left, Anterior Ankle) 3. Primary Dressing Applied Iodoflex 4. Secondary Dressing Dry Gauze Roll Gauze Heel Cup 5. Secured With Tape Notes netting / globoped offloading  Duncan Electronic Signature(s) Signed: 02/29/2020 6:22:31 PM By: Deon Pilling Signed: 03/01/2020 8:12:45 AM By: Linton Ham MD Entered By: Linton Ham on 02/29/2020 17:15:19 -------------------------------------------------------------------------------- Multi-Disciplinary Care Plan Details Patient Name: Date of Service: Samantha Duncan. 02/29/2020 2:45 PM Medical Record Number: [id number] Patient Account Number: [id number] Date of Birth/Sex: Treating RN: 1955-07-30 (64 y.o. Samantha Duncan, [name] Primary Care Monic Engelmann: Floyde Parkins Other Clinician: Referring Analina Filla: Treating Netty Sullivant/Extender: Larene Pickett Weeks in Treatment: 0 Active Inactive Orientation to the Wound Care Program Nursing Diagnoses: Knowledge deficit related to the wound healing center program Goals: Patient/caregiver will verbalize understanding of the [place] Program Date Initiated: 02/29/2020 Target Resolution Date: 03/29/2020 Goal Status: Active Interventions: Provide education on orientation to the wound center Notes: Pain, Acute or Chronic Nursing Diagnoses: Pain, acute or chronic: actual or potential Potential alteration in comfort, pain Goals: Patient will verbalize adequate pain control and receive pain control interventions during procedures as needed Date Initiated: 02/29/2020 Target Resolution Date: 03/29/2020 Goal Status: Active Patient/caregiver will verbalize comfort level met Date Initiated: 02/29/2020 Target Resolution Date: 03/29/2020 Goal Status: Active Interventions: Provide education on pain management Reposition patient for comfort Treatment Activities: Administer pain control measures as ordered : 02/29/2020 Notes: Pressure Nursing Diagnoses: Potential for impaired tissue integrity related to pressure, friction, moisture, and shear Goals: Patient will remain free from development of additional pressure ulcers Date Initiated:  02/29/2020 Target Resolution Date: 03/29/2020 Goal Status: Active Patient/caregiver will verbalize understanding of pressure ulcer management Date Initiated: 02/29/2020 Target Resolution Date: 03/29/2020 Goal Status: Active Interventions: Assess: immobility, friction, shearing, incontinence upon admission and as needed Assess potential for pressure ulcer upon admission and as needed Provide education on pressure ulcers Treatment Activities: Patient referred for pressure reduction/relief devices : 02/29/2020 T ordered outside of clinic : 02/29/2020 est Notes: Wound/Skin Impairment Nursing Diagnoses: Knowledge deficit related to ulceration/compromised skin integrity Goals: Patient/caregiver will verbalize understanding of skin care regimen Date Initiated: 02/29/2020 Target Resolution Date: 03/29/2020 Goal Status: Active Interventions: Assess patient/caregiver ability to obtain necessary supplies Assess patient/caregiver ability to perform ulcer/skin care regimen upon admission and as needed Provide education on ulcer and skin care Treatment Activities: Skin care regimen initiated : 02/29/2020 Topical wound management initiated : 02/29/2020 Notes: Electronic Signature(s) Signed: 02/29/2020 6:22:31 PM By: Deon Pilling Entered By: Deon Pilling on 02/29/2020 16:26:03 -------------------------------------------------------------------------------- Pain Assessment Details Patient Name: Date of Service: Samantha Duncan 02/29/2020 2:45 PM Medical Record Number: [id number] Patient Account Number: [id number] Date of Birth/Sex: Treating RN: 22-Oct-1955 (64 y.o. Samantha Duncan Primary Care Lummie Montijo: Floyde Parkins Other Clinician: Referring Brandonn Capelli: Treating Tempest Frankland/Extender: Larene Pickett Weeks in Treatment: 0 Active Problems Location of Pain Severity and Description of Pain Patient Has Paino No Site Locations Pain Management and Medication Current Pain  Management: Electronic Signature(s) Signed: 02/29/2020 5:54:48 PM By: Kela Millin Entered By: Kela Millin on 02/29/2020 15:48:39 -------------------------------------------------------------------------------- Patient/Caregiver Education Details Patient Name: Date of Service: Samantha Duncan 10/7/2021andnbsp2:45 PM Medical Record Number: [id number] Patient Account Number: [id number] Date of Birth/Gender: Treating RN: August 25, 1955 (64 y.o. Samantha Duncan Primary Care Physician: Floyde Parkins Other Clinician: Referring  Physician: Treating Physician/Extender: Lorin Glass in Treatment: 0 Education Assessment Education Provided To: Patient Education Topics Provided Pain: Handouts: A Guide to Pain Control Methods: Explain/Verbal, Printed Responses: Reinforcements needed Pressure: Handouts: Pressure Ulcers: Care and Offloading, Pressure Ulcers: Care and Offloading 2, Preventing Pressure Ulcers Methods: Explain/Verbal, Printed Responses: Reinforcements needed [place]: o Handouts: Welcome T [facility] [place] o Methods: Explain/Verbal, Printed Responses: Reinforcements needed Wound/Skin Impairment: Handouts: Caring for Your Ulcer, Skin Care Do's and Dont's Methods: Explain/Verbal, Printed Responses: Reinforcements needed Electronic Signature(s) Signed: 02/29/2020 6:22:31 PM By: Deon Pilling Entered By: Deon Pilling on 02/29/2020 16:26:38 -------------------------------------------------------------------------------- Wound Assessment Details Patient Name: Date of Service: Samantha Duncan 02/29/2020 2:45 PM Medical Record Number: [id number] Patient Account Number: [id number] Date of Birth/Sex: Treating RN: [birth year] (64 y.o. Samantha Duncan Primary Care Tracie Dore: Floyde Parkins Other Clinician: Referring Shahed Yeoman: Treating Han Lysne/Extender: Larene Pickett Weeks in  Treatment: 0 Wound Status Wound Number: 1 Primary Etiology: Pressure Ulcer Wound Location: Left Calcaneus Wound Status: Open Wounding Event: Blister Comorbid History: Hypertension Date Acquired: 01/10/2020 Weeks Of Treatment: 0 Clustered Wound: No Wound Measurements Length: (cm) 3.4 Width: (cm) 3.1 Depth: (cm) 0.1 Area: (cm) 8.278 Volume: (cm) 0.828 % Reduction in Area: % Reduction in Volume: Epithelialization: None Tunneling: No Undermining: No Wound Description Classification: Unstageable/Unclassified Wound Margin: Distinct, outline attached Exudate Amount: Medium Exudate Type: Serous Exudate Color: amber Foul Odor After Cleansing: No Slough/Fibrino Yes Wound Bed Granulation Amount: Small (1-33%) Exposed Structure Granulation Quality: Pink Fascia Exposed: No Necrotic Amount: Large (67-100%) Fat Layer (Subcutaneous Tissue) Exposed: Yes Necrotic Quality: Eschar, Adherent Slough Tendon Exposed: No Muscle Exposed: No Joint Exposed: No Bone Exposed: No Treatment Notes Wound #1 (Left Calcaneus) 3. Primary Dressing Applied Iodoflex 4. Secondary Dressing Dry Gauze Roll Gauze Heel Cup 5. Secured With Tape Notes netting / globoped offloading Duncan Electronic Signature(s) Signed: 02/29/2020 5:54:48 PM By: Kela Millin Entered By: Kela Millin on 02/29/2020 15:47:16 -------------------------------------------------------------------------------- Wound Assessment Details Patient Name: Date of Service: Samantha Duncan, Samantha Duncan 02/29/2020 2:45 PM Medical Record Number: [id number] Patient Account Number: [id number] Date of Birth/Sex: Treating RN: 02/12/1956 (64 y.o. Samantha Duncan Primary Care Debhora Titus: Floyde Parkins Other Clinician: Referring Nnamdi Dacus: Treating Lara Palinkas/Extender: Larene Pickett Weeks in Treatment: 0 Wound Status Wound Number: 2 Primary Etiology: Pressure Ulcer Wound Location: Left, Anterior Ankle Wound  Status: Open Wounding Event: Blister Comorbid History: Hypertension Date Acquired: 01/10/2020 Weeks Of Treatment: 0 Clustered Wound: No Wound Measurements Length: (cm) 0.3 Width: (cm) 0.4 Depth: (cm) 0.1 Area: (cm) 0.094 Volume: (cm) 0.009 % Reduction in Area: % Reduction in Volume: Epithelialization: None Tunneling: No Undermining: No Wound Description Classification: Category/Stage III Wound Margin: Distinct, outline attached Exudate Amount: Medium Exudate Type: Serous Exudate Color: amber Foul Odor After Cleansing: No Slough/Fibrino Yes Wound Bed Granulation Amount: Medium (34-66%) Exposed Structure Granulation Quality: Pink Fascia Exposed: No Necrotic Amount: Medium (34-66%) Fat Layer (Subcutaneous Tissue) Exposed: Yes Necrotic Quality: Adherent Slough Tendon Exposed: No Muscle Exposed: No Joint Exposed: No Bone Exposed: No Treatment Notes Wound #2 (Left, Anterior Ankle) 3. Primary Dressing Applied Iodoflex 4. Secondary Dressing Dry Gauze Roll Gauze Heel Cup 5. Secured With Tape Notes netting / globoped offloading Duncan Electronic Signature(s) Signed: 02/29/2020 5:54:48 PM By: Kela Millin Entered By: Kela Millin on 02/29/2020 15:48:27 -------------------------------------------------------------------------------- Vitals Details Patient Name: Date of Service: Samantha Duncan. 02/29/2020 2:45 PM Medical Record Number: [id number] Patient Account Number: [id number]  Date of Birth/Sex: Treating RN: January 05, 1956 (64 y.o. Samantha Duncan Primary Care Alida Greiner: Floyde Parkins Other Clinician: Referring Marley Charlot: Treating Melannie Metzner/Extender: Larene Pickett Weeks in Treatment: 0 Vital Signs Time Taken: 15:15 Temperature (F): 98.6 Height (in): 61 Pulse (bpm): 72 Source: Stated Respiratory Rate (breaths/min): 18 Weight (lbs): 105 Blood Pressure (mmHg): 155/71 Source: Stated Reference Range: 80 - 120 mg / dl Body  Mass Index (BMI): 19.8 Electronic Signature(s) Signed: 02/29/2020 5:54:48 PM By: Kela Millin Entered By: Kela Millin on 02/29/2020 15:25:17

## 2020-03-01 NOTE — Progress Notes (Signed)
Samantha Duncan, Samantha Duncan ([id number]) Visit Report for 02/29/2020 Chief Complaint Document Details Patient Name: Date of Service: Samantha Duncan, Samantha Duncan 02/29/2020 2:45 PM Medical Record Number: [id number] Patient Account Number: [id number] Date of Birth/Sex: Treating RN: 1956/03/17 (64 y.o. Samantha Duncan, Samantha Duncan Primary Care Provider: Antony Madura Other Clinician: Referring Provider: Treating Provider/Extender: Louie Casa Weeks in Treatment: 0 Information Obtained from: Patient Chief Complaint 02/29/2020; patient is here for review of a wound on her left heel and left dorsal foot/ankle Electronic Signature(s) Signed: 03/01/2020 8:12:45 AM By: Baltazar Najjar MD Entered By: Baltazar Najjar on 02/29/2020 17:16:06 -------------------------------------------------------------------------------- Debridement Details Patient Name: Date of Service: Samantha Duncan. 02/29/2020 2:45 PM Medical Record Number: [id number] Patient Account Number: [id number] Date of Birth/Sex: Treating RN: 08/14/1955 (63 y.o. Samantha Duncan, Samantha Duncan Primary Care Provider: Antony Madura Other Clinician: Referring Provider: Treating Provider/Extender: Louie Casa Weeks in Treatment: 0 Debridement Performed for Assessment: Wound #1 Left Calcaneus Performed By: Physician Maxwell Caul., MD Debridement Type: Debridement Level of Consciousness (Pre-procedure): Awake and Alert Pre-procedure Verification/Time Out Yes - 16:05 Taken: Start Time: 16:06 Pain Control: Lidocaine 4% T opical Solution T Area Debrided (L x W): otal 3.4 (cm) x 3.1 (cm) = 10.54 (cm) Tissue and other material debrided: Viable, Non-Viable, Eschar, Slough, Subcutaneous, Skin: Dermis , Skin: Epidermis, Fibrin/Exudate, Slough Level: Skin/Subcutaneous Tissue Debridement Description: Excisional Instrument: Curette Bleeding: Minimum Hemostasis Achieved: Pressure End Time: 16:11 Procedural Pain: 0 Post  Procedural Pain: 2 Response to Treatment: Procedure was tolerated well Level of Consciousness (Post- Awake and Alert procedure): Post Debridement Measurements of Total Wound Length: (cm) 3.4 Stage: Unstageable/Unclassified Width: (cm) 3.1 Depth: (cm) 0.3 Volume: (cm) 2.483 Character of Wound/Ulcer Post Debridement: Improved Post Procedure Diagnosis Same as Pre-procedure Electronic Signature(s) Signed: 02/29/2020 6:22:31 PM By: Shawn Stall Signed: 03/01/2020 8:12:45 AM By: Baltazar Najjar MD Entered By: Baltazar Najjar on 02/29/2020 17:15:31 -------------------------------------------------------------------------------- HPI Details Patient Name: Date of Service: Samantha Duncan 02/29/2020 2:45 PM Medical Record Number: [id number] Patient Account Number: [id number] Date of Birth/Sex: Treating RN: 06-27-1955 (64 y.o. 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 Her problem started in August when she suffered a fall in the bathroom. She was in the ER on 01/13/2020 with CT scan showing nondisplaced sacral and pubic rami fractures. I do not think she was admitted but she represented with severe pain on 8/27 and arrangements were made for her to go to [facility]. At some point somebody put an Ace wrap on her left leg which may have been in the hospital. However the next time her son saw her the same Ace wrap was still in place now with hemorrhagic blisters over the anterior ankle and a black eschared wound on the left heel. Based on a trip to the ER 01/30/2020 the heel looks like a deep tissue injury. The wrap had denuded skin on the left anterior ankle. An x-ray was  negative. The patient is now back at home. She has a roommate and home health aides and her son. They do not have a lot of support by the sound of things. The patient originally walked with an AFO brace because of left foot drop but she has not been using  that out of fear it would put pressure on the heel and I agree with that. However she is anxious to pursue physical therapy if that is possible. It was not really clear to our staff what they have been using on this wound although she does have home health. In further discussion I think it was Betadine back wet to dry dressings. On the anterior foot/ankle they have been using antibiotic ointments. ABI on the left was 0.92 Electronic Signature(s) Signed: 03/01/2020 8:12:45 AM By: Baltazar Najjar MD Entered By: Baltazar Najjar on 02/29/2020 17:29:52 -------------------------------------------------------------------------------- Physical Exam Details Patient Name: Date of Service: Samantha Duncan 02/29/2020 2:45 PM Medical Record Number: [id number] Patient Account Number: [id number] Date of Birth/Sex: Treating RN: April 29, 1956 (64 y.o. Samantha Duncan Primary Care Provider: Antony Madura Other Clinician: Referring Provider: Treating Provider/Extender: Louie Casa Weeks in Treatment: 0 Constitutional Patient is hypertensive.. Pulse regular and within target range for patient.Marland Kitchen Respirations regular, non-labored and within target range.. Temperature is normal and within the target range for the patient.Marland Kitchen Appears in no distress. Respiratory work of breathing is normal. Cardiovascular Both the dorsalis pedis pulse and posterior tibial pulses were palpable on the left. No majorno major skin changes of CVI. Integumentary (Hair, Skin) No systemic skin issues. Neurological Compatible with theright sided weakness and spacticity. Psychiatric appears at normal baseline. Notes Wound exam -The substantial area is on the  tip of the left heel. Completely nonviable necrotic surface. Given the fact she had normal arterial exams and ABIs I went ahead with a debridement using a #5 curette I remove the black eschar and necrotic subcutaneous tissue. I still do not think we are down to completely viable surface but there was no exposed bone and no obvious infection. Dorsally in the crease of the left ankle is a small open area although it remains of the original substantial wound in this area. This should not pose much of a problem Electronic Signature(s) Signed: 03/01/2020 8:12:45 AM By: Baltazar Najjar MD Entered By: Baltazar Najjar on 02/29/2020 17:33:03 -------------------------------------------------------------------------------- Physician Orders Details Patient Name: Date of Service: Samantha Duncan. 02/29/2020 2:45 PM Medical Record Number: [id number] Patient Account Number: [id number] Date of Birth/Sex: Treating RN: 08/21/1955 (65 y.o. Samantha Duncan Primary Care Provider: Antony Madura Other Clinician: Referring Provider: Treating Provider/Extender: Louie Casa Weeks in Treatment: 0 Verbal / Phone Orders: No Diagnosis Coding Follow-up Appointments Return Appointment in 1 week. Dressing Change Frequency Other: - [facility] once a week and wound center once. Skin Barriers/Peri-Wound Care Moisturizing lotion - both legs. Wound Cleansing Clean wound with Wound Cleanser - with dressing changes. May shower with protection. - use cast protectors. Primary Wound Dressing Wound #1 Left Calcaneus Iodoflex Wound #2 Left,Anterior Ankle Iodoflex Secondary Dressing Wound #1 Left Calcaneus Kerlix/Rolled Gauze Dry Gauze Heel Cup Wound #2 Left,Anterior Ankle Kerlix/Rolled Gauze Dry Gauze Edema Control Elevate legs to the level of the heart or above for 30 minutes daily and/or when sitting, a frequency of: - throughout the day. Off-Loading Multipodus Splint to: -  booty boot wear on left foot while resting in bed at night. Ensure to float left heel with pillow while in chair. no pressure to wound. Wedge shoe to: - patient to wear glopped healing sandal to aid transfer from chair to bed and with walking. Home Health Continue Home Health skilled nursing for wound care. [facility] once a week and wound center once. Electronic Signature(s) Signed:  02/29/2020 6:22:31 PM By: Shawn Stalleaton, [name] Signed: 03/01/2020 8:12:45 AM By: Baltazar Najjarobson, Lakeysha Slutsky MD Entered By: Shawn Stalleaton, [name] on 02/29/2020 16:28:59 -------------------------------------------------------------------------------- Problem List Details Patient Name: Date of Service: Samantha BrothersEURE, ELIZA BETH R. 02/29/2020 2:45 PM Medical Record Number: [id number] Patient Account Number: [id number] Date of Birth/Sex: Treating RN: 01/20/1956 (64 y.o. Samantha PickettF) Deaton, Samantha KendallBobbi Primary Care Provider: Antony Maduraoberts, Ronald W Other Clinician: Referring Provider: Treating Provider/Extender: Louie Casaobson, Davisha Linthicum Roberts, Ronald W Weeks in Treatment: 0 Active Problems ICD-10 Encounter Code Description Active Date MDM Diagnosis L89.620 Pressure ulcer of left heel, unstageable 02/29/2020 No Yes L97.528 Non-pressure chronic ulcer of other part of left foot with other specified 02/29/2020 No Yes severity R26.0 Ataxic gait 02/29/2020 No Yes Inactive Problems Resolved Problems Electronic Signature(s) Signed: 03/01/2020 8:12:45 AM By: Baltazar Najjarobson, Camari Quintanilla MD Entered By: Baltazar Najjarobson, Melicia Esqueda on 02/29/2020 16:49:58 -------------------------------------------------------------------------------- Progress Note Details Patient Name: Date of Service: Samantha BrothersEURE, ELIZA BETH R. 02/29/2020 2:45 PM Medical Record Number: [id number] Patient Account Number: [id number] Date of Birth/Sex: Treating RN: 01/20/1956 (64 y.o. 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 Her problem started in August when she suffered a fall in the bathroom. She was in the ER on 01/13/2020 with CT scan showing nondisplaced sacral and pubic rami fractures. I do not think she was admitted but she represented with severe pain on 8/27 and arrangements were made for her to go to [facility]. At some point somebody put an Ace wrap on her left leg which may have been in the hospital. However the next time her son saw her the same Ace wrap was still in place now with hemorrhagic blisters over the anterior ankle and a black eschared wound on the left heel. Based on a trip to the ER 01/30/2020 the heel looks like a deep tissue injury. The wrap had denuded skin on the left anterior ankle. An x-ray was negative. The patient is now back at home. She has a roommate and home health aides and her son. They do not have a lot of support by the sound of things. The patient originally walked with an AFO brace because of left foot drop but she has not been using that out of fear it would put pressure on the heel and I agree with that. However she is anxious to pursue physical therapy if that is possible. It was not really clear to our staff what they have been using on this wound although she does have home health. In further discussion I think it was Betadine back wet to dry dressings. On the anterior foot/ankle they have been using antibiotic ointments. ABI on the left was 0.92 Patient History Allergies Sulfa (Sulfonamide Antibiotics) Family History No family history of Cancer, Diabetes, Heart Disease, Hereditary Spherocytosis, Hypertension,  Kidney Disease, Lung Disease, Seizures, Stroke, Thyroid Problems, Tuberculosis. Social History Former smoker - quit on august 26th, Marital Status - Divorced, Alcohol Use - Never, Drug Use - No History, Caffeine Use - Daily. Medical History Cardiovascular Patient has history of Hypertension Oncologic Denies history of Received Chemotherapy, Received Radiation Medical A Surgical History Notes nd Cardiovascular stroke Neurologic stroke, left sided hemiparesis  Review of Systems (ROS) Constitutional Symptoms (General Health) Denies complaints or symptoms of Fatigue, Fever, Chills, Marked Weight Change. Eyes Denies complaints or symptoms of Dry Eyes, Vision Changes, Glasses / Contacts. Ear/Nose/Mouth/Throat Denies complaints or symptoms of Chronic sinus problems or rhinitis. Respiratory Denies complaints or symptoms of Chronic or frequent coughs, Shortness of Breath. Cardiovascular Denies complaints or symptoms of Chest pain. Gastrointestinal Denies complaints or symptoms of Frequent diarrhea, Nausea, Vomiting. Endocrine Denies complaints or symptoms of Heat/cold intolerance. Genitourinary Denies complaints or symptoms of Frequent urination. Integumentary (Skin) Denies complaints or symptoms of Wounds. Musculoskeletal Denies complaints or symptoms of Muscle Pain, Muscle Weakness. Psychiatric Denies complaints or symptoms of Claustrophobia, Suicidal. Objective Constitutional Patient is hypertensive.. Pulse regular and within target range for patient.Marland Kitchen Respirations regular, non-labored and within target range.. Temperature is normal and within the target range for the patient.Marland Kitchen Appears in no distress. Vitals Time Taken: 3:15 PM, Height: 61 in, Source: Stated, Weight: 105 lbs, Source: Stated, BMI: 19.8, Temperature: 98.6 F, Pulse: 72 bpm, Respiratory Rate: 18 breaths/min, Blood Pressure: 155/71 mmHg. Respiratory work of breathing is normal. Cardiovascular Both the dorsalis  pedis pulse and posterior tibial pulses were palpable on the left. No majorno major skin changes of CVI. Neurological Compatible with theright sided weakness and spacticity. Psychiatric appears at normal baseline. General Notes: Wound exam -The substantial area is on the tip of the left heel. Completely nonviable necrotic surface. Given the fact she had normal arterial exams and ABIs I went ahead with a debridement using a #5 curette I remove the black eschar and necrotic subcutaneous tissue. I still do not think we are down to completely viable surface but there was no exposed bone and no obvious infection. ooDorsally in the crease of the left ankle is a small open area although it remains of the original substantial wound in this area. This should not pose much of a problem Integumentary (Hair, Skin) No systemic skin issues. Wound #1 status is Open. Original cause of wound was Blister. The wound is located on the Left Calcaneus. The wound measures 3.4cm length x 3.1cm width x 0.1cm depth; 8.278cm^2 area and 0.828cm^3 volume. There is Fat Layer (Subcutaneous Tissue) exposed. There is no tunneling or undermining noted. There is a medium amount of serous drainage noted. The wound margin is distinct with the outline attached to the wound base. There is small (1-33%) pink granulation within the wound bed. There is a large (67-100%) amount of necrotic tissue within the wound bed including Eschar and Adherent Slough. Wound #2 status is Open. Original cause of wound was Blister. The wound is located on the Left,Anterior Ankle. The wound measures 0.3cm length x 0.4cm width x 0.1cm depth; 0.094cm^2 area and 0.009cm^3 volume. There is Fat Layer (Subcutaneous Tissue) exposed. There is no tunneling or undermining noted. There is a medium amount of serous drainage noted. The wound margin is distinct with the outline attached to the wound base. There is medium (34-66%) pink granulation within the wound bed.  There is a medium (34-66%) amount of necrotic tissue within the wound bed including Adherent Slough. 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 There was a Excisional Skin/Subcutaneous Tissue Debridement with a total area of 10.54 sq cm performed by Maxwell Caul., MD. With the following instrument(s): Curette to  remove Viable and Non-Viable tissue/material. Material removed includes Eschar, Subcutaneous Tissue, Slough, Skin: Dermis, Skin: Epidermis, and Fibrin/Exudate after achieving pain control using Lidocaine 4% T opical Solution. A time out was conducted at 16:05, prior to the start of the procedure. A Minimum amount of bleeding was controlled with Pressure. The procedure was tolerated well with a pain level of 0 throughout and a pain level of 2 following the procedure. Post Debridement Measurements: 3.4cm length x 3.1cm width x 0.3cm depth; 2.483cm^3 volume. Post debridement Stage noted as Unstageable/Unclassified. Character of Wound/Ulcer Post Debridement is improved. Post procedure Diagnosis Wound #1: Same as Pre-Procedure Plan Follow-up Appointments: Return Appointment in 1 week. Dressing Change Frequency: Other: - [facility] once a week and wound center once. Skin Barriers/Peri-Wound Care: Moisturizing lotion - both legs. Wound Cleansing: Clean wound with Wound Cleanser - with dressing changes. May shower with protection. - use cast protectors. Primary Wound Dressing: Wound #1 Left Calcaneus: Iodoflex Wound #2 Left,Anterior Ankle: Iodoflex Secondary Dressing: Wound #1 Left Calcaneus: Kerlix/Rolled Gauze Dry Gauze Heel Cup Wound #2 Left,Anterior Ankle: Kerlix/Rolled Gauze Dry Gauze Edema Control: Elevate legs to the level  of the heart or above for 30 minutes daily and/or when sitting, a frequency of: - throughout the day. Off-Loading: Multipodus Splint to: - booty boot wear on left foot while resting in bed at night. Ensure to float left heel with pillow while in chair. no pressure to wound. Wedge shoe to: - patient to wear glopped healing sandal to aid transfer from chair to bed and with walking. Home Health: Continue Home Health skilled nursing for wound care. [facility] once a week and wound center once. 1. We will use Iodoflex to further help the debridement of the left heel. At this point I did not feel the need for x-rays or cultures although that may change she did have an x-ray during the 9/7 ER visit that was negative 2. We will also use Iodoflex to the dorsal foot small open area hopefully to pull this together 3. We had an extensive discussion about offloading. They do have a cushion boot for her to wear in bed at night and I encouraged this. We gave her a heel offloading boot to give her attempt to walk although I had some concerns about her gait and balance using a quad cane nevertheless if she is going to walk on anything this would be the safest for her wound. 4. I saw no evidence of any infection and no cultures were done no further antibiotics 5. Orders call to home health I spent 40 minutes in review of this patient's past medical history, face-to-face evaluation and preparation of this record Electronic Signature(s) Signed: 03/01/2020 8:12:45 AM By: Baltazar Najjar MD Entered By: Baltazar Najjar on 02/29/2020 17:36:19 -------------------------------------------------------------------------------- HxROS Details Patient Name: Date of Service: Samantha Duncan. 02/29/2020 2:45 PM Medical Record Number: [id number] Patient Account Number: [id number] Date of Birth/Sex: Treating RN: 1955-10-08 (64 y.o. Harvest Dark Primary Care Provider: Antony Madura Other  Clinician: Referring Provider: Treating Provider/Extender: Louie Casa Weeks in Treatment: 0 Constitutional Symptoms (General Health) Complaints and Symptoms: Negative for: Fatigue; Fever; Chills; Marked Weight Change Eyes Complaints and Symptoms: Negative for: Dry Eyes; Vision Changes; Glasses / Contacts Ear/Nose/Mouth/Throat Complaints and Symptoms: Negative for: Chronic sinus problems or rhinitis Respiratory Complaints and Symptoms: Negative for: Chronic or frequent coughs; Shortness of Breath Cardiovascular Complaints and Symptoms: Negative for: Chest pain Medical History: Positive for: Hypertension Past  Medical History Notes: stroke Gastrointestinal Complaints and Symptoms: Negative for: Frequent diarrhea; Nausea; Vomiting Endocrine Complaints and Symptoms: Negative for: Heat/cold intolerance Genitourinary Complaints and Symptoms: Negative for: Frequent urination Integumentary (Skin) Complaints and Symptoms: Negative for: Wounds Musculoskeletal Complaints and Symptoms: Negative for: Muscle Pain; Muscle Weakness Psychiatric Complaints and Symptoms: Negative for: Claustrophobia; Suicidal Hematologic/Lymphatic Immunological Neurologic Medical History: Past Medical History Notes: stroke, left sided hemiparesis Oncologic Medical History: Negative for: Received Chemotherapy; Received Radiation Immunizations Pneumococcal Vaccine: Received Pneumococcal Vaccination: Yes Implantable Devices None Family and Social History Cancer: No; Diabetes: No; Heart Disease: No; Hereditary Spherocytosis: No; Hypertension: No; Kidney Disease: No; Lung Disease: No; Seizures: No; Stroke: No; Thyroid Problems: No; Tuberculosis: No; Former smoker - quit on august 26th; Marital Status - Divorced; Alcohol Use: Never; Drug Use: No History; Caffeine Use: Daily; Financial Concerns: No; Food, Clothing or Shelter Needs: No; Support System Lacking: No; Transportation  Concerns: No Electronic Signature(s) Signed: 02/29/2020 5:54:48 PM By: Cherylin Mylar Signed: 03/01/2020 8:12:45 AM By: Baltazar Najjar MD Entered By: Cherylin Mylar on 02/29/2020 15:30:26 -------------------------------------------------------------------------------- SuperBill Details Patient Name: Date of Service: Samantha Duncan 02/29/2020 Medical Record Number: [id number] Patient Account Number: [id number] Date of Birth/Sex: Treating RN: Dec 25, 1955 (64 y.o. 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 R26.0 Ataxic gait Facility Procedures CPT4 Code: [id number] Description: 99214 - WOUND CARE VISIT-LEV 4 EST PT Modifier: Quantity: 1 CPT4 Code: [id number] Description: 11042 - DEB SUBQ TISSUE 20 SQ CM/< ICD-10 Diagnosis Description L89.620 Pressure ulcer of left heel, unstageable Modifier: Quantity: 1 Physician Procedures : CPT4 Code Description Modifier [id number] WC PHYS LEVEL 3 NEW PT 25 ICD-10 Diagnosis Description L89.620 Pressure ulcer of left heel, unstageable L97.528 Non-pressure chronic ulcer of other part of left foot with other specified severity R26.0 Ataxic gait Quantity: 1 : [id number] 11042 - WC PHYS SUBQ TISS 20 SQ CM ICD-10 Diagnosis Description L89.620 Pressure ulcer of left heel, unstageable Quantity: 1 Electronic Signature(s) Signed: 02/29/2020 6:22:31 PM By: Shawn Stall Signed: 03/01/2020 8:12:45 AM By: Baltazar Najjar MD Entered By: Shawn Stall on 02/29/2020 18:10:16

## 2020-03-07 ENCOUNTER — Encounter (HOSPITAL_BASED_OUTPATIENT_CLINIC_OR_DEPARTMENT_OTHER): Payer: Medicare PPO | Admitting: Internal Medicine

## 2020-03-07 DIAGNOSIS — L97528 Non-pressure chronic ulcer of other part of left foot with other specified severity: Secondary | ICD-10-CM | POA: Diagnosis not present
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
## 2020-03-07 NOTE — Progress Notes (Signed)
HARUMI, YAMIN ([id number]) Visit Report for 03/07/2020 Arrival Information Details Patient Name: Date of Service: Samantha Duncan, Samantha Duncan 03/07/2020 3:00 PM Medical Record Number: [id number] Patient Account Number: [id number] Date of Birth/Sex: Treating RN: 26-Mar-1956 (64 y.o. Samantha Duncan Primary Care Roberto Hlavaty: Lorene Dy Other Clinician: Referring Deronte Solis: Treating Echo Propp/Extender: Sabino Gasser in Treatment: 1 Visit Information History Since Last Visit All ordered tests and consults were completed: No Patient Arrived: Samantha Duncan Added or deleted any medications: No Arrival Time: 15:14 Any new allergies or adverse reactions: No Accompanied By: son Had a fall or experienced change in No Transfer Assistance: None activities of daily living that may affect Patient Identification Verified: Yes risk of falls: Secondary Verification Process Completed: Yes Signs or symptoms of abuse/neglect since last visito No Patient Requires Transmission-Based Precautions: No Hospitalized since last visit: No Patient Has Alerts: No Implantable device outside of the clinic excluding No cellular tissue based products placed in the center since last visit: Has Dressing in Place as Prescribed: Yes Pain Present Now: No Electronic Signature(s) Signed: 03/07/2020 5:51:40 PM By: Carlene Coria RN Entered By: Carlene Coria on 03/07/2020 15:14:52 -------------------------------------------------------------------------------- Encounter Discharge Information Details Patient Name: Date of Service: Samantha Slates R. 03/07/2020 3:00 PM Medical Record Number: [id number] Patient Account Number: [id number] Date of Birth/Sex: Treating RN: 11/30/1955 (64 y.o. Samantha Duncan Primary Care Beverley Sherrard: Lorene Dy Other Clinician: Referring Tomika Eckles: Treating Fe Okubo/Extender: Sabino Gasser in Treatment: 1 Encounter Discharge Information Items Post  Procedure Vitals Discharge Condition: Stable Temperature (F): 98.6 Ambulatory Status: Wheelchair Pulse (bpm): 69 Discharge Destination: Home Respiratory Rate (breaths/min): 18 Transportation: Private Auto Blood Pressure (mmHg): 154/68 Accompanied By: son Schedule Follow-up Appointment: Yes Clinical Summary of Care: Patient Declined Electronic Signature(s) Signed: 03/07/2020 4:30:54 PM By: Mikeal Hawthorne EMT/HBOT/SD Entered By: Mikeal Hawthorne on 03/07/2020 16:30:41 -------------------------------------------------------------------------------- Lower Extremity Assessment Details Patient Name: Date of Service: Samantha Slates R. 03/07/2020 3:00 PM Medical Record Number: [id number] Patient Account Number: [id number] Date of Birth/Sex: Treating RN: [birth year] (64 y.o. Samantha Duncan Primary Care Cailan Antonucci: Lorene Dy Other Clinician: Referring Kimbria Camposano: Treating Vyolet Sakuma/Extender: Sabino Gasser in Treatment: 1 Edema Assessment Assessed: Samantha Duncan: No] [Right: No] Edema: [Left: Ye] [Right: s] Calf Left: Right: Point of Measurement: From Medial Instep 25 cm Ankle Left: Right: Point of Measurement: From Medial Instep 21 cm Electronic Signature(s) Signed: 03/07/2020 5:51:40 PM By: Carlene Coria RN Entered By: Carlene Coria on 03/07/2020 15:16:03 -------------------------------------------------------------------------------- Multi Wound Chart Details Patient Name: Date of Service: Samantha Slates R. 03/07/2020 3:00 PM Medical Record Number: [id number] Patient Account Number: [id number] Date of Birth/Sex: Treating RN: 1955/10/10 (64 y.o. Samantha Duncan, Samantha Duncan Primary Care Ilyana Manuele: Lorene Dy Other Clinician: Referring Naoko Diperna: Treating Seriah Brotzman/Extender: Sabino Gasser in Treatment: 1 Vital Signs Height(in): 61 Pulse(bpm): 74 Weight(lbs): 105 Blood Pressure(mmHg): 154/68 Body Mass Index(BMI): 20 Temperature(F):  98.6 Respiratory Rate(breaths/min): 18 Photos: [1:No Photos Left Calcaneus] [2:No Photos Left, Anterior Ankle] [N/A:N/A N/A] Wound Location: [1:Blister] [2:Blister] [N/A:N/A] Wounding Event: [1:Pressure Ulcer] [2:Pressure Ulcer] [N/A:N/A] Primary Etiology: [1:Hypertension] [2:Hypertension] [N/A:N/A] Comorbid History: [1:01/10/2020] [2:01/10/2020] [N/A:N/A] Date Acquired: [1:1] [2:1] [N/A:N/A] Weeks of Treatment: [1:Open] [2:Open] [N/A:N/A] Wound Status: [1:3x3.1x0.6] [2:0.2x0.2x0.2] [N/A:N/A] Measurements L x W x D (cm) [1:7.304] [2:0.031] [N/A:N/A] A (cm) : rea [1:4.383] [2:0.006] [N/A:N/A] Volume (cm) : [1:11.80%] [2:67.00%] [N/A:N/A] % Reduction in A rea: [1:-429.30%] [2:33.30%] [N/A:N/A] % Reduction in Volume: [1:Unstageable/Unclassified] [2:Category/Stage III] [N/A:N/A] Classification: [1:Medium] [2:Medium] [N/A:N/A] Exudate A mount: [1:Serous] [2:Serous] [N/A:N/A] Exudate Type: [1:amber] [  2:amber] [N/A:N/A] Exudate Color: [1:Distinct, outline attached] [2:Distinct, outline attached] [N/A:N/A] Wound Margin: [1:Small (1-33%)] [2:Medium (34-66%)] [N/A:N/A] Granulation A mount: [1:Pink] [2:Pink] [N/A:N/A] Granulation Quality: [1:Large (67-100%)] [2:Medium (34-66%)] [N/A:N/A] Necrotic Amount: [1:Eschar, Adherent Slough] [2:Adherent Slough] [N/A:N/A] Necrotic Tissue: [1:Fat Layer (Subcutaneous Tissue): Yes Fat Layer (Subcutaneous Tissue): Yes N/A] Exposed Structures: [1:Fascia: No Tendon: No Muscle: No Joint: No Bone: No None] [2:Fascia: No Tendon: No Muscle: No Joint: No Bone: No None] [N/A:N/A] Epithelialization: [1:Debridement - Excisional] [2:N/A] [N/A:N/A] Debridement: Pre-procedure Verification/Time Out 14:50 [2:N/A] [N/A:N/A] Taken: [1:Lidocaine 4% Topical Solution] [2:N/A] [N/A:N/A] Pain Control: [1:Subcutaneous, Slough] [2:N/A] [N/A:N/A] Tissue Debrided: [1:Skin/Subcutaneous Tissue] [2:N/A] [N/A:N/A] Level: [1:9.3] [2:N/A] [N/A:N/A] Debridement A (sq cm): [1:rea Curette]  [2:N/A] [N/A:N/A] Instrument: [1:Minimum] [2:N/A] [N/A:N/A] Bleeding: [1:Pressure] [2:N/A] [N/A:N/A] Hemostasis A chieved: [1:0] [2:N/A] [N/A:N/A] Procedural Pain: [1:0] [2:N/A] [N/A:N/A] Post Procedural Pain: [1:Procedure was tolerated well] [2:N/A] [N/A:N/A] Debridement Treatment Response: [1:3x3.1x0.6] [2:N/A] [N/A:N/A] Post Debridement Measurements L x W x D (cm) [1:4.383] [2:N/A] [N/A:N/A] Post Debridement Volume: (cm) [1:Debridement] [2:N/A] [N/A:N/A] Treatment Notes Wound #1 (Left Calcaneus) 1. Cleanse With Wound Cleanser 3. Primary Dressing Applied Iodoflex 4. Secondary Dressing Dry Gauze Roll Gauze Heel Cup 5. Secured With Tape Notes netting / globoped offloading Duncan Wound #2 (Left, Anterior Ankle) 1. Cleanse With Wound Cleanser 3. Primary Dressing Applied Iodoflex 4. Secondary Dressing Dry Gauze Roll Gauze 5. Secured With Tape Notes netting / globoped offloading Duncan Electronic Signature(s) Signed: 03/07/2020 5:33:07 PM By: Linton Ham MD Signed: 03/07/2020 5:51:00 PM By: Deon Pilling Entered By: Linton Ham on 03/07/2020 17:23:05 -------------------------------------------------------------------------------- Multi-Disciplinary Care Plan Details Patient Name: Date of Service: Samantha Slates R. 03/07/2020 3:00 PM Medical Record Number: [id number] Patient Account Number: [id number] Date of Birth/Sex: Treating RN: 1956-04-12 (64 y.o. Samantha Duncan, Tammi Klippel Primary Care Maahi Lannan: Lorene Dy Other Clinician: Referring Juliani Laduke: Treating Christyana Corwin/Extender: Sabino Gasser in Treatment: 1 Active Inactive Pain, Acute or Chronic Nursing Diagnoses: Pain, acute or chronic: actual or potential Potential alteration in comfort, pain Goals: Patient will verbalize adequate pain control and receive pain control interventions during procedures as needed Date Initiated: 02/29/2020 Target Resolution Date: 03/29/2020 Goal Status:  Active Patient/caregiver will verbalize comfort level met Date Initiated: 02/29/2020 Target Resolution Date: 03/29/2020 Goal Status: Active Interventions: Provide education on pain management Reposition patient for comfort Treatment Activities: Administer pain control measures as ordered : 02/29/2020 Notes: Pressure Nursing Diagnoses: Potential for impaired tissue integrity related to pressure, friction, moisture, and shear Goals: Patient will remain free from development of additional pressure ulcers Date Initiated: 02/29/2020 Target Resolution Date: 03/29/2020 Goal Status: Active Patient/caregiver will verbalize understanding of pressure ulcer management Date Initiated: 02/29/2020 Target Resolution Date: 03/29/2020 Goal Status: Active Interventions: Assess: immobility, friction, shearing, incontinence upon admission and as needed Assess potential for pressure ulcer upon admission and as needed Provide education on pressure ulcers Treatment Activities: Patient referred for pressure reduction/relief devices : 02/29/2020 T ordered outside of clinic : 02/29/2020 est Notes: Wound/Skin Impairment Nursing Diagnoses: Knowledge deficit related to ulceration/compromised skin integrity Goals: Patient/caregiver will verbalize understanding of skin care regimen Date Initiated: 02/29/2020 Target Resolution Date: 03/29/2020 Goal Status: Active Interventions: Assess patient/caregiver ability to obtain necessary supplies Assess patient/caregiver ability to perform ulcer/skin care regimen upon admission and as needed Provide education on ulcer and skin care Treatment Activities: Skin care regimen initiated : 02/29/2020 Topical wound management initiated : 02/29/2020 Notes: Electronic Signature(s) Signed: 03/07/2020 5:51:00 PM By: Deon Pilling Entered By: Deon Pilling on 03/07/2020 15:46:16 -------------------------------------------------------------------------------- Pain Assessment  Details Patient Name:  Date of Service: Samantha Duncan, Samantha Duncan 03/07/2020 3:00 PM Medical Record Number: [id number] Patient Account Number: [id number] Date of Birth/Sex: Treating RN: Jan 05, 1956 (64 y.o. Samantha Duncan Primary Care Martel Galvan: Lorene Dy Other Clinician: Referring Brandey Vandalen: Treating Reid Regas/Extender: Sabino Gasser in Treatment: 1 Active Problems Location of Pain Severity and Description of Pain Patient Has Paino No Site Locations Pain Management and Medication Current Pain Management: Electronic Signature(s) Signed: 03/07/2020 5:51:40 PM By: Carlene Coria RN Entered By: Carlene Coria on 03/07/2020 15:15:47 -------------------------------------------------------------------------------- Patient/Caregiver Education Details Patient Name: Date of Service: Samantha Duncan 10/14/2021andnbsp3:00 PM Medical Record Number: [id number] Patient Account Number: [id number] Date of Birth/Gender: Treating RN: Nov 06, 1955 (64 y.o. Samantha Duncan Primary Care Physician: Lorene Dy Other Clinician: Referring Physician: Treating Physician/Extender: Sabino Gasser in Treatment: 1 Education Assessment Education Provided To: Patient Education Topics Provided Pain: Handouts: A Guide to Pain Control Methods: Explain/Verbal Responses: Reinforcements needed Electronic Signature(s) Signed: 03/07/2020 5:51:00 PM By: Deon Pilling Entered By: Deon Pilling on 03/07/2020 15:46:29 -------------------------------------------------------------------------------- Wound Assessment Details Patient Name: Date of Service: Samantha Duncan, Samantha Duncan 03/07/2020 3:00 PM Medical Record Number: [id number] Patient Account Number: [id number] Date of Birth/Sex: Treating RN: [birth year] (63 y.o. Samantha Duncan Primary Care Altheria Shadoan: Lorene Dy Other Clinician: Referring Norm Wray: Treating Demi Trieu/Extender: Sabino Gasser in Treatment: 1 Wound Status Wound Number: 1 Primary Etiology: Pressure Ulcer Wound Location: Left Calcaneus Wound Status: Open Wounding Event: Blister Comorbid History: Hypertension Date Acquired: 01/10/2020 Weeks Of Treatment: 1 Clustered Wound: No Wound Measurements Length: (cm) 3 Width: (cm) 3.1 Depth: (cm) 0.6 Area: (cm) 7.304 Volume: (cm) 4.383 % Reduction in Area: 11.8% % Reduction in Volume: -429.3% Epithelialization: None Tunneling: No Undermining: No Wound Description Classification: Unstageable/Unclassified Wound Margin: Distinct, outline attached Exudate Amount: Medium Exudate Type: Serous Exudate Color: amber Foul Odor After Cleansing: No Slough/Fibrino Yes Wound Bed Granulation Amount: Small (1-33%) Exposed Structure Granulation Quality: Pink Fascia Exposed: No Necrotic Amount: Large (67-100%) Fat Layer (Subcutaneous Tissue) Exposed: Yes Necrotic Quality: Eschar, Adherent Slough Tendon Exposed: No Muscle Exposed: No Joint Exposed: No Bone Exposed: No Treatment Notes Wound #1 (Left Calcaneus) 1. Cleanse With Wound Cleanser 3. Primary Dressing Applied Iodoflex 4. Secondary Dressing Dry Gauze Roll Gauze Heel Cup 5. Secured With Tape Notes netting / globoped offloading Duncan Electronic Signature(s) Signed: 03/07/2020 5:51:40 PM By: Carlene Coria RN Entered By: Carlene Coria on 03/07/2020 15:23:40 -------------------------------------------------------------------------------- Wound Assessment Details Patient Name: Date of Service: Samantha Duncan, Samantha Duncan 03/07/2020 3:00 PM Medical Record Number: [id number] Patient Account Number: [id number] Date of Birth/Sex: Treating RN: 05-Jan-1956 (64 y.o. Samantha Duncan Primary Care Kent Braunschweig: Lorene Dy Other Clinician: Referring Mikaila Grunert: Treating Kenyata Guess/Extender: Sabino Gasser in Treatment: 1 Wound Status Wound Number: 2 Primary Etiology: Pressure  Ulcer Wound Location: Left, Anterior Ankle Wound Status: Open Wounding Event: Blister Comorbid History: Hypertension Date Acquired: 01/10/2020 Weeks Of Treatment: 1 Clustered Wound: No Wound Measurements Length: (cm) 0.2 Width: (cm) 0.2 Depth: (cm) 0.2 Area: (cm) 0.031 Volume: (cm) 0.006 % Reduction in Area: 67% % Reduction in Volume: 33.3% Epithelialization: None Tunneling: No Undermining: No Wound Description Classification: Category/Stage III Wound Margin: Distinct, outline attached Exudate Amount: Medium Exudate Type: Serous Exudate Color: amber Foul Odor After Cleansing: No Slough/Fibrino Yes Wound Bed Granulation Amount: Medium (34-66%) Exposed Structure Granulation Quality: Pink Fascia Exposed: No Necrotic Amount: Medium (34-66%) Fat Layer (Subcutaneous Tissue) Exposed: Yes Necrotic Quality: Adherent Slough Tendon Exposed: No Muscle Exposed: No Joint Exposed: No  Bone Exposed: No Treatment Notes Wound #2 (Left, Anterior Ankle) 1. Cleanse With Wound Cleanser 3. Primary Dressing Applied Iodoflex 4. Secondary Dressing Dry Gauze Roll Gauze 5. Secured With Tape Notes netting / globoped offloading Duncan Electronic Signature(s) Signed: 03/07/2020 5:51:40 PM By: Carlene Coria RN Entered By: Carlene Coria on 03/07/2020 15:23:48 -------------------------------------------------------------------------------- Vitals Details Patient Name: Date of Service: Samantha Slates R. 03/07/2020 3:00 PM Medical Record Number: [id number] Patient Account Number: [id number] Date of Birth/Sex: Treating RN: 1956-04-16 (64 y.o. Samantha Duncan Primary Care Taylr Meuth: Lorene Dy Other Clinician: Referring Zerline Melchior: Treating Jigar Zielke/Extender: Sabino Gasser in Treatment: 1 Vital Signs Time Taken: 15:14 Temperature (F): 98.6 Height (in): 61 Pulse (bpm): 69 Weight (lbs): 105 Respiratory Rate (breaths/min): 18 Body Mass Index (BMI):  19.8 Blood Pressure (mmHg): 154/68 Reference Range: 80 - 120 mg / dl Electronic Signature(s) Signed: 03/07/2020 5:51:40 PM By: Carlene Coria RN Entered By: Carlene Coria on 03/07/2020 15:15:40

## 2020-03-11 ENCOUNTER — Encounter (HOSPITAL_BASED_OUTPATIENT_CLINIC_OR_DEPARTMENT_OTHER): Payer: Medicare PPO | Admitting: Internal Medicine

## 2020-03-14 ENCOUNTER — Other Ambulatory Visit: Payer: Self-pay

## 2020-03-14 ENCOUNTER — Encounter (HOSPITAL_BASED_OUTPATIENT_CLINIC_OR_DEPARTMENT_OTHER): Payer: Medicare PPO | Admitting: Internal Medicine

## 2020-03-14 DIAGNOSIS — L97528 Non-pressure chronic ulcer of other part of left foot with other specified severity: Secondary | ICD-10-CM | POA: Diagnosis not present

## 2020-03-14 NOTE — Progress Notes (Signed)
Samantha Duncan, Samantha Duncan ([id number]) Visit Report for 03/14/2020 Arrival Information Details Patient Name: Date of Service: Samantha Duncan, Samantha Duncan 03/14/2020 3:30 PM Medical Record Number: [id number] Patient Account Number: [id number] Date of Birth/Sex: Treating RN: 1955-12-15 (64 y.o. Nancy Fetter Primary Care Myasia Sinatra: Lorene Dy Other Clinician: Referring Terrell Shimko: Treating Colden Samaras/Extender: Sabino Gasser in Treatment: 2 Visit Information History Since Last Visit Added or deleted any medications: No Patient Arrived: Wheel Chair Any new allergies or adverse reactions: No Arrival Time: 15:35 Had a fall or experienced change in No Accompanied By: son activities of daily living that may affect Transfer Assistance: None risk of falls: Patient Identification Verified: Yes Signs or symptoms of abuse/neglect since last visito No Secondary Verification Process Completed: Yes Hospitalized since last visit: No Patient Requires Transmission-Based Precautions: No Implantable device outside of the clinic excluding No Patient Has Alerts: No cellular tissue based products placed in the center since last visit: Has Dressing in Place as Prescribed: Yes Pain Present Now: No Electronic Signature(s) Signed: 03/14/2020 5:24:31 PM By: Levan Hurst RN, BSN Entered By: Levan Hurst on 03/14/2020 15:36:13 -------------------------------------------------------------------------------- Lower Extremity Assessment Details Patient Name: Date of Service: Samantha Duncan 03/14/2020 3:30 PM Medical Record Number: [id number] Patient Account Number: [id number] Date of Birth/Sex: Treating RN: [birth year] (64 y.o. Nancy Fetter Primary Care Jarl Sellitto: Lorene Dy Other Clinician: Referring Kellis Topete: Treating Keshauna Degraffenreid/Extender: Sabino Gasser in Treatment: 2 Edema Assessment Assessed: Shirlyn Goltz: No] Patrice Paradise: No] Edema: [Left: Ye] [Right:  s] Calf Left: Right: Point of Measurement: From Medial Instep 25 cm Ankle Left: Right: Point of Measurement: From Medial Instep 21 cm Vascular Assessment Pulses: Dorsalis Pedis Palpable: [Left:Yes] Electronic Signature(s) Signed: 03/14/2020 5:24:31 PM By: Levan Hurst RN, BSN Entered By: Levan Hurst on 03/14/2020 15:45:20 -------------------------------------------------------------------------------- Multi Wound Chart Details Patient Name: Date of Service: Samantha Duncan. 03/14/2020 3:30 PM Medical Record Number: [id number] Patient Account Number: [id number] Date of Birth/Sex: Treating RN: 1955-10-09 (64 y.o. [name], [name] Primary Care Kawhi Diebold: Lorene Dy Other Clinician: Referring Zabdiel Dripps: Treating Graciano Batson/Extender: Sabino Gasser in Treatment: 2 Vital Signs Height(in): 28 Pulse(bpm): 62 Weight(lbs): 105 Blood Pressure(mmHg): 125/78 Body Mass Index(BMI): 20 Temperature(F): 98.5 Respiratory Rate(breaths/min): 16 Photos: [1:No Photos Left Calcaneus] [2:No Photos Left, Anterior Ankle] [N/A:N/A N/A] Wound Location: [1:Blister] [2:Blister] [N/A:N/A] Wounding Event: [1:Pressure Ulcer] [2:Pressure Ulcer] [N/A:N/A] Primary Etiology: [1:Hypertension] [2:Hypertension] [N/A:N/A] Comorbid History: [1:01/10/2020] [2:01/10/2020] [N/A:N/A] Date Acquired: [1:2] [2:2] [N/A:N/A] Weeks of Treatment: [1:Open] [2:Open] [N/A:N/A] Wound Status: [1:2.6x3x0.5] [2:0.3x0.2x0.2] [N/A:N/A] Measurements L x W x D (cm) [1:6.126] [2:0.047] [N/A:N/A] A (cm) : rea [1:3.063] [2:0.009] [N/A:N/A] Volume (cm) : [1:26.00%] [2:50.00%] [N/A:N/A] % Reduction in A rea: [1:-269.90%] [2:0.00%] [N/A:N/A] % Reduction in Volume: [1:Unstageable/Unclassified] [2:Category/Stage III] [N/A:N/A] Classification: [1:Medium] [2:Small] [N/A:N/A] Exudate A mount: [1:Serosanguineous] [2:Serosanguineous] [N/A:N/A] Exudate Type: [1:red, brown] [2:red, brown] [N/A:N/A] Exudate  Color: [1:Distinct, outline attached] [2:Distinct, outline attached] [N/A:N/A] Wound Margin: [1:Medium (34-66%)] [2:Large (67-100%)] [N/A:N/A] Granulation A mount: [1:Pink] [2:Red] [N/A:N/A] Granulation Quality: [1:Medium (34-66%)] [2:None Present (0%)] [N/A:N/A] Necrotic A mount: [1:Fat Layer (Subcutaneous Tissue): Yes Fat Layer (Subcutaneous Tissue): Yes N/A] Exposed Structures: [1:Fascia: No Tendon: No Muscle: No Joint: No Bone: No None] [2:Fascia: No Tendon: No Muscle: No Joint: No Bone: No Small (1-33%)] [N/A:N/A] Epithelialization: [1:Debridement - Excisional] [2:N/A] [N/A:N/A] Debridement: Pre-procedure Verification/Time Out 15:55 [2:N/A] [N/A:N/A] Taken: [1:Lidocaine 4% Topical Solution] [2:N/A] [N/A:N/A] Pain Control: [1:Subcutaneous, Slough] [2:N/A] [N/A:N/A] Tissue Debrided: [1:Skin/Subcutaneous Tissue] [2:N/A] [N/A:N/A] Level: [1:7.8] [2:N/A] [N/A:N/A] Debridement A (sq cm): [1:rea Curette] [  2:N/A] [N/A:N/A] Instrument: [1:Moderate] [2:N/A] [N/A:N/A] Bleeding: [1:Pressure] [2:N/A] [N/A:N/A] Hemostasis A chieved: [1:0] [2:N/A] [N/A:N/A] Procedural Pain: [1:0] [2:N/A] [N/A:N/A] Post Procedural Pain: [1:Procedure was tolerated well] [2:N/A] [N/A:N/A] Debridement Treatment Response: [1:2.6x3x0.5] [2:N/A] [N/A:N/A] Post Debridement Measurements L x W x D (cm) [1:3.063] [2:N/A] [N/A:N/A] Post Debridement Volume: (cm) [1:Unstageable/Unclassified] [2:N/A] [N/A:N/A] Post Debridement Stage: [1:Debridement] [2:N/A] [N/A:N/A] Procedures Performed: Treatment Notes Electronic Signature(s) Signed: 03/14/2020 5:31:58 PM By: Linton Ham MD Signed: 03/14/2020 5:47:32 PM By: Deon Pilling Entered By: Linton Ham on 03/14/2020 17:24:54 -------------------------------------------------------------------------------- Multi-Disciplinary Care Plan Details Patient Name: Date of Service: Samantha Duncan. 03/14/2020 3:30 PM Medical Record Number: [id number] Patient Account Number:  [id number] Date of Birth/Sex: Treating RN: June 20, 1955 (64 y.o. [name], Tammi Klippel Primary Care Hamsa Laurich: Lorene Dy Other Clinician: Referring Gay Moncivais: Treating Verena Shawgo/Extender: Sabino Gasser in Treatment: 2 Active Inactive Pain, Acute or Chronic Nursing Diagnoses: Pain, acute or chronic: actual or potential Potential alteration in comfort, pain Goals: Patient will verbalize adequate pain control and receive pain control interventions during procedures as needed Date Initiated: 02/29/2020 Target Resolution Date: 03/29/2020 Goal Status: Active Patient/caregiver will verbalize comfort level met Date Initiated: 02/29/2020 Target Resolution Date: 03/29/2020 Goal Status: Active Interventions: Provide education on pain management Reposition patient for comfort Treatment Activities: Administer pain control measures as ordered : 02/29/2020 Notes: Pressure Nursing Diagnoses: Potential for impaired tissue integrity related to pressure, friction, moisture, and shear Goals: Patient will remain free from development of additional pressure ulcers Date Initiated: 02/29/2020 Target Resolution Date: 03/29/2020 Goal Status: Active Patient/caregiver will verbalize understanding of pressure ulcer management Date Initiated: 02/29/2020 Target Resolution Date: 03/29/2020 Goal Status: Active Interventions: Assess: immobility, friction, shearing, incontinence upon admission and as needed Assess potential for pressure ulcer upon admission and as needed Provide education on pressure ulcers Treatment Activities: Patient referred for pressure reduction/relief devices : 02/29/2020 T ordered outside of clinic : 02/29/2020 est Notes: Wound/Skin Impairment Nursing Diagnoses: Knowledge deficit related to ulceration/compromised skin integrity Goals: Patient/caregiver will verbalize understanding of skin care regimen Date Initiated: 02/29/2020 Target Resolution Date:  03/29/2020 Goal Status: Active Interventions: Assess patient/caregiver ability to obtain necessary supplies Assess patient/caregiver ability to perform ulcer/skin care regimen upon admission and as needed Provide education on ulcer and skin care Treatment Activities: Skin care regimen initiated : 02/29/2020 Topical wound management initiated : 02/29/2020 Notes: Electronic Signature(s) Signed: 03/14/2020 5:47:32 PM By: Deon Pilling Entered By: Deon Pilling on 03/14/2020 15:48:56 -------------------------------------------------------------------------------- Pain Assessment Details Patient Name: Date of Service: Samantha Duncan, Samantha Duncan 03/14/2020 3:30 PM Medical Record Number: [id number] Patient Account Number: [id number] Date of Birth/Sex: Treating RN: Jun 20, 1955 (64 y.o. Nancy Fetter Primary Care Clemens Lachman: Lorene Dy Other Clinician: Referring Theone Bowell: Treating Demetric Dunnaway/Extender: Sabino Gasser in Treatment: 2 Active Problems Location of Pain Severity and Description of Pain Patient Has Paino No Site Locations Pain Management and Medication Current Pain Management: Electronic Signature(s) Signed: 03/14/2020 5:24:31 PM By: Levan Hurst RN, BSN Entered By: Levan Hurst on 03/14/2020 15:36:40 -------------------------------------------------------------------------------- Patient/Caregiver Education Details Patient Name: Date of Service: Samantha Duncan 10/21/2021andnbsp3:30 PM Medical Record Number: [id number] Patient Account Number: [id number] Date of Birth/Gender: Treating RN: 11/09/1955 (64 y.o. [name] Primary Care Physician: Lorene Dy Other Clinician: Referring Physician: Treating Physician/Extender: Sabino Gasser in Treatment: 2 Education Assessment Education Provided To: Patient Education Topics Provided Pressure: Handouts: Preventing Pressure Ulcers Methods:  Explain/Verbal Responses: Reinforcements needed Electronic Signature(s) Signed: 03/14/2020 5:47:32 PM By: Deon Pilling Entered By: Deon Pilling on 03/14/2020  15:49:10 -------------------------------------------------------------------------------- Wound Assessment Details Patient Name: Date of Service: Samantha Duncan, Samantha Duncan 03/14/2020 3:30 PM Medical Record Number: [id number] Patient Account Number: [id number] Date of Birth/Sex: Treating RN: 16-Dec-1955 (64 y.o. Nancy Fetter Primary Care Inetha Maret: Lorene Dy Other Clinician: Referring Avriana Joo: Treating Talullah Abate/Extender: Sabino Gasser in Treatment: 2 Wound Status Wound Number: 1 Primary Etiology: Pressure Ulcer Wound Location: Left Calcaneus Wound Status: Open Wounding Event: Blister Comorbid History: Hypertension Date Acquired: 01/10/2020 Weeks Of Treatment: 2 Clustered Wound: No Wound Measurements Length: (cm) 2.6 Width: (cm) 3 Depth: (cm) 0.5 Area: (cm) 6.126 Volume: (cm) 3.063 % Reduction in Area: 26% % Reduction in Volume: -269.9% Epithelialization: None Tunneling: No Undermining: No Wound Description Classification: Unstageable/Unclassified Wound Margin: Distinct, outline attached Exudate Amount: Medium Exudate Type: Serosanguineous Exudate Color: red, brown Foul Odor After Cleansing: No Slough/Fibrino Yes Wound Bed Granulation Amount: Medium (34-66%) Exposed Structure Granulation Quality: Pink Fascia Exposed: No Necrotic Amount: Medium (34-66%) Fat Layer (Subcutaneous Tissue) Exposed: Yes Necrotic Quality: Adherent Slough Tendon Exposed: No Muscle Exposed: No Joint Exposed: No Bone Exposed: No Electronic Signature(s) Signed: 03/14/2020 5:24:31 PM By: Levan Hurst RN, BSN Entered By: Levan Hurst on 03/14/2020 15:47:14 -------------------------------------------------------------------------------- Wound Assessment Details Patient Name: Date of  Service: Samantha Duncan. 03/14/2020 3:30 PM Medical Record Number: [id number] Patient Account Number: [id number] Date of Birth/Sex: Treating RN: 05/06/1956 (64 y.o. Nancy Fetter Primary Care Lovelee Forner: Lorene Dy Other Clinician: Referring Anaiz Qazi: Treating Rayon Mcchristian/Extender: Sabino Gasser in Treatment: 2 Wound Status Wound Number: 2 Primary Etiology: Pressure Ulcer Wound Location: Left, Anterior Ankle Wound Status: Open Wounding Event: Blister Comorbid History: Hypertension Date Acquired: 01/10/2020 Weeks Of Treatment: 2 Clustered Wound: No Wound Measurements Length: (cm) 0.3 Width: (cm) 0.2 Depth: (cm) 0.2 Area: (cm) 0.047 Volume: (cm) 0.009 % Reduction in Area: 50% % Reduction in Volume: 0% Epithelialization: Small (1-33%) Tunneling: No Undermining: No Wound Description Classification: Category/Stage III Wound Margin: Distinct, outline attached Exudate Amount: Small Exudate Type: Serosanguineous Exudate Color: red, brown Foul Odor After Cleansing: No Slough/Fibrino No Wound Bed Granulation Amount: Large (67-100%) Exposed Structure Granulation Quality: Red Fascia Exposed: No Necrotic Amount: None Present (0%) Fat Layer (Subcutaneous Tissue) Exposed: Yes Tendon Exposed: No Muscle Exposed: No Joint Exposed: No Bone Exposed: No Electronic Signature(s) Signed: 03/14/2020 5:24:31 PM By: Levan Hurst RN, BSN Entered By: Levan Hurst on 03/14/2020 15:47:40 -------------------------------------------------------------------------------- Vitals Details Patient Name: Date of Service: Samantha Duncan. 03/14/2020 3:30 PM Medical Record Number: [id number] Patient Account Number: [id number] Date of Birth/Sex: Treating RN: 02-08-1956 (64 y.o. Nancy Fetter Primary Care Dala Breault: Lorene Dy Other Clinician: Referring Tolbert Matheson: Treating Kellen Hover/Extender: Sabino Gasser in Treatment:  2 Vital Signs Time Taken: 15:36 Temperature (F): 98.5 Height (in): 61 Pulse (bpm): 64 Weight (lbs): 105 Respiratory Rate (breaths/min): 16 Body Mass Index (BMI): 19.8 Blood Pressure (mmHg): 125/78 Reference Range: 80 - 120 mg / dl Electronic Signature(s) Signed: 03/14/2020 5:24:31 PM By: Levan Hurst RN, BSN Entered By: Levan Hurst on 03/14/2020 15:36:34

## 2020-03-14 NOTE — Progress Notes (Signed)
[name], [name] ([id number]) Visit Report for 03/14/2020 Debridement Details Patient Name: Date of Service: Samantha Duncan 03/14/2020 3:30 PM Medical Record Number: [id number] Patient Account Number: [id number] Date of Birth/Sex: Treating RN: [birth year] (64 y.o. Samantha Duncan, Samantha Duncan Primary Care Provider: Burton [name] Other Clinician: Referring Provider: Treating Provider/Extender: Nichola Sizer in Treatment: 2 Debridement Performed for Assessment: Wound #1 Left Calcaneus Performed By: Physician Maxwell Caul., MD Debridement Type: Debridement Level of Consciousness (Pre-procedure): Awake and Alert Pre-procedure Verification/Time Out Yes - 15:55 Taken: Start Time: 15:56 Pain Control: Lidocaine 4% T opical Solution T Area Debrided (L x W): otal 2.6 (cm) x 3 (cm) = 7.8 (cm) Tissue and other material debrided: Viable, Non-Viable, Slough, Subcutaneous, Skin: Dermis , Fibrin/Exudate, Slough Level: Skin/Subcutaneous Tissue Debridement Description: Excisional Instrument: Curette Bleeding: Moderate Hemostasis Achieved: Pressure End Time: 16:00 Procedural Pain: 0 Post Procedural Pain: 0 Response to Treatment: Procedure was tolerated well Level of Consciousness (Post- Awake and Alert procedure): Post Debridement Measurements of Total Wound Length: (cm) 2.6 Stage: Unstageable/Unclassified Width: (cm) 3 Depth: (cm) 0.5 Volume: (cm) 3.063 Character of Wound/Ulcer Post Debridement: Requires Further Debridement Post Procedure Diagnosis Same as Pre-procedure Electronic Signature(s) Signed: 03/14/2020 5:31:58 PM By: Baltazar Najjar MD Signed: 03/14/2020 5:47:32 PM By: Shawn Stall Entered By: Shawn Stall on 03/14/2020 16:00:43 -------------------------------------------------------------------------------- HPI Details Patient Name: Date of Service: Samantha Duncan. 03/14/2020 3:30 PM Medical Record Number: [id number] Patient Account Number:  [id number] Date of Birth/Sex: Treating RN: 1955/12/22 (64 y.o. 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 Her problem started in August when she suffered a fall in the bathroom. She was in the ER on 01/13/2020 with CT scan showing nondisplaced sacral and pubic rami fractures. I do not think she was admitted but she represented with severe pain on 8/27 and arrangements were made for her to go to [facility]. At some point somebody put an Ace wrap on her left leg which may have been in the hospital. However the next time her son saw her the same Ace wrap was still in place now with hemorrhagic blisters over the anterior ankle and a black eschared wound on the left heel. Based on a trip to the ER 01/30/2020 the heel looks like a deep tissue injury. The wrap had denuded skin on the left anterior ankle. An x-ray was negative. The patient is now back at home. She has a roommate and home health aides and her son. They do not have a lot of support by the sound of things. The patient originally walked with an AFO brace because of left foot drop but she has not been using that out of fear it would put pressure on the heel and I agree with that. However she is anxious to pursue physical therapy if that is possible. It was not really clear to our staff what they have been using on this wound although she does have home health. In further discussion I think it was Betadine back wet to dry dressings. On the anterior foot/ankle they have been using antibiotic ointments. ABI on the left was 0.92 03/07/2020; severe wrap injury as described above. There is an area on the dorsal aspect of her ankle at the  crease with her  foot as well as a more impressive area over the heel. The area on the ankle looks as though it is trying to close however the area on the heel still requires an extensive debridement of necrotic tissue fortunately there is been no evidence of infection. Using Iodoflex 10/21; the patient has 1 small open area on the dorsal aspect of her ankle. I think this is feeling in we have been using Iodosorb. The heel still has a very necrotic surface we have been using Iodoflex on this Electronic Signature(s) Signed: 03/14/2020 5:31:58 PM By: Baltazar Najjar MD Entered By: Baltazar Najjar on 03/14/2020 17:26:00 -------------------------------------------------------------------------------- Physical Exam Details Patient Name: Date of Service: Samantha Duncan. 03/14/2020 3:30 PM Medical Record Number: [id number] Patient Account Number: [id number] Date of Birth/Sex: Treating RN: [birth year] (64 y.o. Samantha Duncan Primary Care Provider: Burton [name] Other Clinician: Referring Provider: Treating Provider/Extender: Nichola Sizer in Treatment: 2 Constitutional Sitting or standing Blood Pressure is within target range for patient.. Pulse regular and within target range for patient.Marland Kitchen Respirations regular, non-labored and within target range.. Temperature is normal and within the target range for the patient.Marland Kitchen Appears in no distress. Notes Wound exam; substantial area on the tip of the left heel. Once again an extensive more aggressive and deeper debridement. Fortunately there is no exposed bone but this keeps on reappearing The dorsal area in the crease of the left ankle seems smaller but still may prove a difficult area to close Electronic Signature(s) Signed: 03/14/2020 5:31:58 PM By: Baltazar Najjar MD Entered By: Baltazar Najjar on 03/14/2020 17:27:10 -------------------------------------------------------------------------------- Physician Orders  Details Patient Name: Date of Service: Samantha Duncan. 03/14/2020 3:30 PM Medical Record Number: [id number] Patient Account Number: [id number] Date of Birth/Sex: Treating RN: [birth year] (64 y.o. Samantha Duncan Primary Care Provider: Burton [name] Other Clinician: Referring Provider: Treating Provider/Extender: Nichola Sizer in Treatment: 2 Verbal / Phone Orders: No Diagnosis Coding ICD-10 Coding Code Description [id number] Pressure ulcer of left heel, unstageable L97.528 Non-pressure chronic ulcer of other part of left foot with other specified severity R26.0 Ataxic gait Follow-up Appointments Return Appointment in 2 weeks. Dressing Change Frequency Other: - [facility] twice a week on the week patient is not coming to wound center. [facility] once a week when patient has wound care appointment. Skin Barriers/Peri-Wound Care Moisturizing lotion - both legs. Wound Cleansing Clean wound with Wound Cleanser - with dressing changes. May shower with protection. - use cast protectors. Primary Wound Dressing Wound #1 Left Calcaneus Iodoflex Wound #2 Left,Anterior Ankle Iodoflex - ensure to lightly pack into wound bed. Secondary Dressing Wound #1 Left Calcaneus Kerlix/Rolled Gauze Dry Gauze Heel Cup Wound #2 Left,Anterior Ankle Kerlix/Rolled Gauze Dry Gauze Edema Control Elevate legs to the level of the heart or above for 30 minutes daily and/or when sitting, a frequency of: - throughout the day. Off-Loading Multipodus Splint to: - booty boot wear on left foot while resting in bed at night. Ensure to float left heel with pillow while in chair. no pressure to wound. Wedge shoe to: - patient to wear glopped healing sandal to aid transfer from chair to bed and with walking. Home Health Continue Home Health skilled nursing for wound care. [facility] twice a week on the week patient is not coming to wound center. [facility] once a week when  patient has wound care appointment. Electronic Signature(s) Signed: 03/14/2020 5:31:58 PM By: Baltazar Najjar MD Signed: 03/14/2020 5:47:32 PM By: Shawn Stall  Entered By: Shawn Stall on 03/14/2020 16:06:52 -------------------------------------------------------------------------------- Problem List Details Patient Name: Date of Service: Samantha Duncan, FULLEN 03/14/2020 3:30 PM Medical Record Number: [id number] Patient Account Number: [id number] Date of Birth/Sex: Treating RN: [birth year] (64 y.o. Samantha Duncan, Samantha Duncan Primary Care Provider: Burton [name] Other Clinician: Referring Provider: Treating Provider/Extender: Nichola Sizer in Treatment: 2 Active Problems ICD-10 Encounter Code Description Active Date MDM Diagnosis L89.620 Pressure ulcer of left heel, unstageable 02/29/2020 No Yes L97.528 Non-pressure chronic ulcer of other part of left foot with other specified 02/29/2020 No Yes severity R26.0 Ataxic gait 02/29/2020 No Yes Inactive Problems Resolved Problems Electronic Signature(s) Signed: 03/14/2020 5:31:58 PM By: Baltazar Najjar MD Entered By: Baltazar Najjar on 03/14/2020 17:24:37 -------------------------------------------------------------------------------- Progress Note Details Patient Name: Date of Service: Samantha Duncan. 03/14/2020 3:30 PM Medical Record Number: [id number] Patient Account Number: [id number] Date of Birth/Sex: Treating RN: November 05, 1955 (64 y.o. 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 Her problem started in August when she suffered a fall in the bathroom. She was in the ER on 01/13/2020 with CT scan showing  nondisplaced sacral and pubic rami fractures. I do not think she was admitted but she represented with severe pain on 8/27 and arrangements were made for her to go to [facility]. At some point somebody put an Ace wrap on her left leg which may have been in the hospital. However the next time her son saw her the same Ace wrap was still in place now with hemorrhagic blisters over the anterior ankle and a black eschared wound on the left heel. Based on a trip to the ER 01/30/2020 the heel looks like a deep tissue injury. The wrap had denuded skin on the left anterior ankle. An x-ray was negative. The patient is now back at home. She has a roommate and home health aides and her son. They do not have a lot of support by the sound of things. The patient originally walked with an AFO brace because of left foot drop but she has not been using that out of fear it would put pressure on the heel and I agree with that. However she is anxious to pursue physical therapy if that is possible. It was not really clear to our staff what they have been using on this wound although she does have home health. In further discussion I think it was Betadine back wet to dry dressings. On the anterior foot/ankle they have been using antibiotic ointments. ABI on the left was 0.92 03/07/2020; severe wrap injury as described above. There is an area on the dorsal aspect of her ankle at the crease with her foot as well as a more impressive area over the heel. The area on the ankle looks as though it is trying to close however the area on the heel still requires an extensive debridement of necrotic tissue fortunately there is been no evidence of infection. Using Iodoflex 10/21; the patient has 1 small open area on the dorsal aspect of her ankle. I think this is feeling in we have been using Iodosorb. The heel still has a very necrotic surface we have been using Iodoflex on this Objective Constitutional Sitting or standing  Blood Pressure is within target range for  patient.. Pulse regular and within target range for patient.Marland Kitchen. Respirations regular, non-labored and within target range.. Temperature is normal and within the target range for the patient.Marland Kitchen. Appears in no distress. Vitals Time Taken: 3:36 PM, Height: 61 in, Weight: 105 lbs, BMI: 19.8, Temperature: 98.5 F, Pulse: 64 bpm, Respiratory Rate: 16 breaths/min, Blood Pressure: 125/78 mmHg. General Notes: Wound exam; substantial area on the tip of the left heel. Once again an extensive more aggressive and deeper debridement. Fortunately there is no exposed bone but this keeps on reappearing ooThe dorsal area in the crease of the left ankle seems smaller but still may prove a difficult area to close Integumentary (Hair, Skin) Wound #1 status is Open. Original cause of wound was Blister. The wound is located on the Left Calcaneus. The wound measures 2.6cm length x 3cm width x 0.5cm depth; 6.126cm^2 area and 3.063cm^3 volume. There is Fat Layer (Subcutaneous Tissue) exposed. There is no tunneling or undermining noted. There is a medium amount of serosanguineous drainage noted. The wound margin is distinct with the outline attached to the wound base. There is medium (34-66%) pink granulation within the wound bed. There is a medium (34-66%) amount of necrotic tissue within the wound bed including Adherent Slough. Wound #2 status is Open. Original cause of wound was Blister. The wound is located on the Left,Anterior Ankle. The wound measures 0.3cm length x 0.2cm width x 0.2cm depth; 0.047cm^2 area and 0.009cm^3 volume. There is Fat Layer (Subcutaneous Tissue) exposed. There is no tunneling or undermining noted. There is a small amount of serosanguineous drainage noted. The wound margin is distinct with the outline attached to the wound base. There is large (67-100%) red granulation within the wound bed. There is no necrotic tissue within the wound bed. 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 There was a Excisional Skin/Subcutaneous Tissue Debridement with a total area of 7.8 sq cm performed by Maxwell Caulobson, Oree Mirelez G., MD. With the following instrument(s): Curette to remove Viable and Non-Viable tissue/material. Material removed includes Subcutaneous Tissue, Slough, Skin: Dermis, and Fibrin/Exudate after achieving pain control using Lidocaine 4% T opical Solution. A time out was conducted at 15:55, prior to the start of the procedure. A Moderate amount of bleeding was controlled with Pressure. The procedure was tolerated well with a pain level of 0 throughout and a pain level of 0 following the procedure. Post Debridement Measurements: 2.6cm length x 3cm width x 0.5cm depth; 3.063cm^3 volume. Post debridement Stage noted as Unstageable/Unclassified. Character of Wound/Ulcer Post Debridement requires further debridement. Post procedure Diagnosis Wound #1: Same as Pre-Procedure Plan Follow-up Appointments: Return Appointment in 2 weeks. Dressing Change Frequency: Other: - [facility] twice a week on the week patient is not coming to wound center. [facility] once a week when patient has wound care appointment. Skin Barriers/Peri-Wound Care: Moisturizing lotion - both legs. Wound Cleansing: Clean wound with Wound Cleanser - with dressing changes. May shower with protection. - use cast protectors. Primary Wound Dressing: Wound #1 Left Calcaneus: Iodoflex Wound #2 Left,Anterior Ankle: Iodoflex - ensure to lightly pack into wound bed. Secondary Dressing: Wound #1 Left Calcaneus: Kerlix/Rolled Gauze Dry Gauze Heel Cup Wound #2 Left,Anterior Ankle: Kerlix/Rolled Gauze Dry Gauze Edema Control: Elevate legs to the level of  the heart or above for 30 minutes  daily and/or when sitting, a frequency of: - throughout the day. Off-Loading: Multipodus Splint to: - booty boot wear on left foot while resting in bed at night. Ensure to float left heel with pillow while in chair. no pressure to wound. Wedge shoe to: - patient to wear glopped healing sandal to aid transfer from chair to bed and with walking. Home Health: Continue Home Health skilled nursing for wound care. [facility] twice a week on the week patient is not coming to wound center. [facility] once a week when patient has wound care appointment. 1. Continue with Iodoflex and Iodosorb 2. If her left heel is still necrotic at next visit I am going to have to change the dressing 3. She does not have an arterial issue 4. No evidence of infection Electronic Signature(s) Signed: 03/14/2020 5:31:58 PM By: Baltazar Najjar MD Entered By: Baltazar Najjar on 03/14/2020 17:28:11 -------------------------------------------------------------------------------- SuperBill Details Patient Name: Date of Service: Samantha Duncan 03/14/2020 Medical Record Number: [id number] Patient Account Number: [id number] Date of Birth/Sex: Treating RN: June 28, 1955 (64 y.o. Samantha Duncan Primary Care Provider: Burton [name] Other Clinician: Referring Provider: Treating Provider/Extender: Nichola Sizer in Treatment: 2 Diagnosis Coding ICD-10 Codes Code Description [phone] Pressure ulcer of left heel, unstageable L97.528 Non-pressure chronic ulcer of other part of left foot with other specified severity R26.0 Ataxic gait Facility Procedures CPT4 Code: [id number] Description: 11042 - DEB SUBQ TISSUE 20 SQ CM/< ICD-10 Diagnosis Description L89.620 Pressure ulcer of left heel, unstageable Modifier: Quantity: 1 Physician Procedures : CPT4 Code Description Modifier [id number] 11042 - WC PHYS SUBQ TISS 20 SQ CM ICD-10 Diagnosis Description L89.620 Pressure  ulcer of left heel, unstageable Quantity: 1 Electronic Signature(s) Signed: 03/14/2020 5:31:58 PM By: Baltazar Najjar MD Entered By: Baltazar Najjar on 03/14/2020 17:28:41

## 2020-03-21 ENCOUNTER — Encounter (HOSPITAL_BASED_OUTPATIENT_CLINIC_OR_DEPARTMENT_OTHER): Payer: Medicare PPO | Admitting: Internal Medicine

## 2020-03-27 ENCOUNTER — Encounter (HOSPITAL_BASED_OUTPATIENT_CLINIC_OR_DEPARTMENT_OTHER): Payer: Medicare PPO | Admitting: Physician Assistant

## 2020-03-27 ENCOUNTER — Other Ambulatory Visit: Payer: Self-pay

## 2020-03-27 ENCOUNTER — Encounter (HOSPITAL_BASED_OUTPATIENT_CLINIC_OR_DEPARTMENT_OTHER): Payer: Medicare PPO | Attending: Physician Assistant | Admitting: Physician Assistant

## 2020-03-27 DIAGNOSIS — Z09 Encounter for follow-up examination after completed treatment for conditions other than malignant neoplasm: Secondary | ICD-10-CM | POA: Insufficient documentation

## 2020-03-27 DIAGNOSIS — M21372 Foot drop, left foot: Secondary | ICD-10-CM | POA: Insufficient documentation

## 2020-03-27 DIAGNOSIS — Z8673 Personal history of transient ischemic attack (TIA), and cerebral infarction without residual deficits: Secondary | ICD-10-CM | POA: Insufficient documentation

## 2020-03-27 DIAGNOSIS — L97422 Non-pressure chronic ulcer of left heel and midfoot with fat layer exposed: Secondary | ICD-10-CM | POA: Diagnosis not present

## 2020-03-27 DIAGNOSIS — L89620 Pressure ulcer of left heel, unstageable: Secondary | ICD-10-CM | POA: Insufficient documentation

## 2020-03-27 NOTE — Progress Notes (Addendum)
Samantha Duncan ([id number]) Visit Report for 03/27/2020 Chief Complaint Document Details Patient Name: Date of Service: Samantha Duncan 03/27/2020 4:00 PM Medical Record Number: [id number] Patient Account Number: [id number] Date of Birth/Sex: Treating RN: 1956-04-29 (64 y.o. Tommye Standard Primary Care Provider: Burton [name] Other Clinician: Referring Provider: Treating Provider/Extender: Iona Beard in Treatment: 3 Information Obtained from: Patient Chief Complaint 02/29/2020; patient is here for review of a wound on her left heel and left dorsal foot/ankle Electronic Signature(s) Signed: 03/27/2020 4:06:39 PM By: Lenda Kelp PA-C Entered By: Lenda Kelp on 03/27/2020 16:06:38 -------------------------------------------------------------------------------- HPI Details Patient Name: Date of Service: Samantha Rooks R. 03/27/2020 4:00 PM Medical Record Number: [id number] Patient Account Number: [id number] Date of Birth/Sex: Treating RN: 1956-02-19 (64 y.o. 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 Her problem started in August when she suffered a fall in the bathroom. She was in the ER on 01/13/2020 with CT scan showing nondisplaced sacral and pubic rami fractures. I do not think she was admitted but she represented with severe pain on 8/27 and arrangements were made for her to go to [place] [facility] care. At some point somebody put an Ace wrap on her left leg which may have been in the hospital. However the next time her son saw her the same Ace wrap was still in place now with hemorrhagic blisters over the anterior ankle and a black  eschared wound on the left heel. Based on a trip to the ER 01/30/2020 the heel looks like a deep tissue injury. The wrap had denuded skin on the left anterior ankle. An x-ray was negative. The patient is now back at home. She has a roommate and home health aides and her son. They do not have a lot of support by the sound of things. The patient originally walked with an AFO brace because of left foot drop but she has not been using that out of fear it would put pressure on the heel and I agree with that. However she is anxious to pursue physical therapy if that is possible. It was not really clear to our staff what they have been using on this wound although she does have home health. In further discussion I think it was Betadine back wet to dry dressings. On the anterior foot/ankle they have been using antibiotic ointments. ABI on the left was 0.92 03/07/2020; severe wrap injury as described above. There is an area on the dorsal aspect of her ankle at the crease with her foot as well as a more impressive area over the heel. The area on the ankle looks as though it is trying to close however the area on the heel still requires an extensive debridement of necrotic tissue fortunately there is been no evidence of infection. Using Iodoflex 10/21; the patient has 1 small open area on the dorsal aspect of her ankle. I think this is feeling in we have been using Iodosorb. The heel still has a very necrotic surface we have been using Iodoflex on this 03/27/2020 on evaluation today patient appears to be doing well in regard to her ulcers. The dorsal foot actually appears to be healed based on what I am seeing today. The heel is  improving with the Iodoflex I feel like this is doing a great job. Electronic Signature(s) Signed: 03/27/2020 5:03:43 PM By: Lenda KelpStone III, Sharada Albornoz PA-C Entered By: Lenda KelpStone III, Chenae Brager on 03/27/2020 17:03:43 -------------------------------------------------------------------------------- Physical  Exam Details Patient Name: Date of Service: Samantha BrothersURE, [name] 03/27/2020 4:00 PM Medical Record Number: [id number] Patient Account Number: [id number] Date of Birth/Sex: Treating RN: 1956/01/05 (64 y.o. Tommye StandardF) Boehlein, Linda Primary Care Provider: Burton Apleyoberts, Ronald Other Clinician: Referring Provider: Treating Provider/Extender: Iona BeardStone III, Bertil Brickey Roberts, Ronald Weeks in Treatment: 3 Constitutional Well-nourished and well-hydrated in no acute distress. Respiratory normal breathing without difficulty. Psychiatric this patient is able to make decisions and demonstrates good insight into disease process. Alert and Oriented x 3. pleasant and cooperative. Notes Upon inspection patient's wound bed actually showed signs of good granulation at this time. There does not appear to be evidence of active infection which is great news and overall I feel like she is making excellent progress at this time which is great news. Overall I feel like that we should continue with the Iodoflex at this time. Electronic Signature(s) Signed: 03/27/2020 5:13:28 PM By: Lenda KelpStone III, Simona Rocque PA-C Entered By: Lenda KelpStone III, Chantry Headen on 03/27/2020 17:13:28 -------------------------------------------------------------------------------- Physician Orders Details Patient Name: Date of Service: Samantha BrothersEURE, [name] 03/27/2020 4:00 PM Medical Record Number: [id number] Patient Account Number: [id number] Date of Birth/Sex: Treating RN: 1956/01/05 (64 y.o. 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 - with Dr. Leanord Hawkingobson Return  A Dressing Change Frequency Other: - [facility] twice a week on the week patient is not coming to wound center. [facility] once a week when patient has wound care appointment. Skin Barriers/Peri-Wound Care Moisturizing lotion - both legs. Wound Cleansing Clean wound with Wound Cleanser - with dressing changes. May shower with protection. - use cast protectors. Primary Wound Dressing Foam - to protect anterior ankle Wound #1 Left Calcaneus Iodoflex Secondary Dressing Wound #1 Left Calcaneus Kerlix/Rolled Gauze Dry Gauze Heel Cup Edema Control Elevate legs to the level of the heart or above for 30 minutes daily and/or when sitting, a frequency of: - throughout the day. Off-Loading Multipodus Splint to: - booty boot wear on left foot while resting in bed at night. Ensure to float left heel with pillow while in chair. no pressure to wound. Wedge shoe to: - patient to wear globoped healing sandal to aid transfer from chair to bed and with walking. Home Health Continue Home Health skilled nursing for wound care. [facility] twice a week on the week patient is not coming to wound center. [facility] once a week when patient has wound care appointment. Electronic Signature(s) Signed: 03/27/2020 5:20:58 PM By: Lenda KelpStone III, Valoria Tamburri PA-C Signed: 03/27/2020 5:51:45 PM By: Zenaida DeedBoehlein, Linda RN, BSN Entered By: Zenaida DeedBoehlein, Linda on 03/27/2020 16:56:36 -------------------------------------------------------------------------------- Problem List Details Patient Name: Date of Service: Samantha RooksEURE, [name] 03/27/2020 4:00 PM Medical Record Number: [id number] Patient Account Number: [id number] Date of Birth/Sex: Treating RN: 1956/01/05 (64 y.o. 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 03/27/2020 4:00 PM Medical Record Number: [id number] Patient Account Number: [id number] Date of Birth/Sex: Treating RN: Oct 01, 1955 (64 y.o. 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 Her problem started in August when she suffered a fall in the bathroom. She was in the ER on 01/13/2020 with CT scan showing nondisplaced sacral and pubic rami fractures. I do not think she was admitted but she represented with severe pain on 8/27 and arrangements were made for her to go to [facility] health care. At some point somebody put an Ace wrap on her left leg which may have been in the hospital. However the next time her son saw her the same Ace wrap was still in place now with hemorrhagic blisters over the anterior ankle and a black eschared wound on the left heel. Based on a trip to the ER 01/30/2020 the heel looks like a deep tissue injury. The wrap had denuded skin on  the left anterior ankle. An x-ray was negative. The patient is now back at home. She has a roommate and home health aides and her son. They do not have a lot of support by the sound of things. The patient originally walked with an AFO brace because of left foot drop but she has not been using that out of fear it would put pressure on the heel and I agree with that. However she is anxious to pursue physical therapy if that is possible. It was not really clear to our staff what they have been using on this wound although she does have home health. In further discussion I think it was Betadine back wet to dry dressings. On the anterior foot/ankle they have been using antibiotic ointments. ABI on the left was 0.92 03/07/2020; severe wrap injury as described above. There is an area on the dorsal aspect of her ankle at the crease with her foot as well as a more impressive area over the heel. The area on the ankle looks as though it is trying to close however the area on the heel still requires an extensive debridement of necrotic tissue fortunately there is been no evidence of infection. Using Iodoflex 10/21; the patient has 1 small open area on the dorsal aspect of her ankle. I think this is feeling in we have been using Iodosorb. The heel still has a very necrotic surface we have been using Iodoflex on this 03/27/2020 on evaluation today patient appears to be doing well in regard to her ulcers. The dorsal foot actually appears to be healed based on what I am seeing today. The heel is improving with the Iodoflex I feel like this is doing a great job. Objective Constitutional Well-nourished and well-hydrated in  no acute distress. Vitals Time Taken: 4:24 PM, Height: 61 in, Weight: 105 lbs, BMI: 19.8, Temperature: 98.7 F, Pulse: 59 bpm, Respiratory Rate: 16 breaths/min, Blood Pressure: 157/72 mmHg. Respiratory normal breathing without difficulty. Psychiatric this patient is able to make decisions and  demonstrates good insight into disease process. Alert and Oriented x 3. pleasant and cooperative. General Notes: Upon inspection patient's wound bed actually showed signs of good granulation at this time. There does not appear to be evidence of active infection which is great news and overall I feel like she is making excellent progress at this time which is great news. Overall I feel like that we should continue with the Iodoflex at this time. Integumentary (Hair, Skin) Wound #1 status is Open. Original cause of wound was Blister. The wound is located on the Left Calcaneus. The wound measures 2.3cm length x 3.9cm width x 0.5cm depth; 7.045cm^2 area and 3.523cm^3 volume. There is Fat Layer (Subcutaneous Tissue) exposed. There is no tunneling or undermining noted. There is a medium amount of serosanguineous drainage noted. The wound margin is distinct with the outline attached to the wound base. There is medium (34-66%) pink granulation within the wound bed. There is a medium (34-66%) amount of necrotic tissue within the wound bed including Adherent Slough. Wound #2 status is Healed - Epithelialized. Original cause of wound was Blister. The wound is located on the Left,Anterior Ankle. The wound measures 0cm length x 0cm width x 0cm depth; 0cm^2 area and 0cm^3 volume. There is Fat Layer (Subcutaneous Tissue) exposed. There is no tunneling or undermining noted. There is a small amount of serous drainage noted. The wound margin is distinct with the outline attached to the wound base. There is large (67-100%) pink, pale granulation within the wound bed. There is no necrotic tissue within the wound bed. 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 - with Dr. Leanord Hawking Dressing Change Frequency: Other: - [facility]  [facility] twice a week on the week patient is not coming to wound center. [facility] once a week when patient has wound care appointment. Skin Barriers/Peri-Wound Care: Moisturizing lotion - both legs. Wound Cleansing: Clean wound with Wound Cleanser - with dressing changes. May shower with protection. - use cast protectors. Primary Wound Dressing: Foam - to protect anterior ankle Wound #1 Left Calcaneus: Iodoflex Secondary Dressing: Wound #1 Left Calcaneus: Kerlix/Rolled Gauze Dry Gauze Heel Cup Edema Control: Elevate legs to the level of the heart or above for 30 minutes daily and/or when sitting, a frequency of: - throughout the day. Off-Loading: Multipodus Splint to: - booty boot wear on left foot while resting in bed at night. Ensure to float left heel with pillow while in chair. no pressure to wound. Wedge shoe to: - patient to wear globoped healing sandal to aid transfer from chair to bed and with walking. Home Health: Continue Home Health skilled nursing for wound care. [facility] twice a week on the week patient is not coming to wound center. [facility] once a week when patient has wound care appointment. 1. I would recommend currently that we go ahead and continue with Iodoflex for the patient's heel ulcer I feel like this is doing a great job. 2. I am also can recommend at this time that the patient continue with appropriate offloading she is doing a great job which is  excellent news. 3. I am also can recommend that she continue to change this dressing twice a week I think that appropriate home health is doing 1 her son is doing the other. We will see patient back for reevaluation in 2 weeks here in the clinic. If anything worsens or changes patient will contact our office for additional recommendations. Electronic Signature(s) Signed: 03/27/2020 5:13:58 PM By: Lenda Kelp PA-C Entered By: Lenda Kelp on 03/27/2020  17:13:57 -------------------------------------------------------------------------------- SuperBill Details Patient Name: Date of Service: Samantha Duncan 03/27/2020 Medical Record Number: [id number] Patient Account Number: [id number] Date of Birth/Sex: Treating RN: September 03, 1955 (64 y.o. 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

## 2020-03-28 NOTE — Progress Notes (Signed)
Samantha Duncan, Samantha Duncan ([id number]) Visit Report for 03/27/2020 Arrival Information Details Patient Name: Date of Service: Samantha Duncan, Samantha Duncan 03/27/2020 4:00 PM Medical Record Number: [id number] Patient Account Number: [id number] Date of Birth/Sex: Treating RN: 1956/03/14 (64 y.o. Nancy Fetter Primary Care Provider: Lorene Dy Other Clinician: Referring Provider: Treating Provider/Extender: Yehuda Savannah in Treatment: 3 Visit Information History Since Last Visit Added or deleted any medications: No Patient Arrived: Wheel Chair Any new allergies or adverse reactions: No Arrival Time: 16:23 Had a fall or experienced change in No Accompanied By: son activities of daily living that may affect Transfer Assistance: None risk of falls: Patient Identification Verified: Yes Signs or symptoms of abuse/neglect since last visito No Secondary Verification Process Completed: Yes Hospitalized since last visit: No Patient Requires Transmission-Based Precautions: No Implantable device outside of the clinic excluding No Patient Has Alerts: No cellular tissue based products placed in the center since last visit: Has Dressing in Place as Prescribed: Yes Pain Present Now: No Electronic Signature(s) Signed: 03/28/2020 5:54:11 PM By: Levan Hurst RN, BSN Entered By: Levan Hurst on 03/27/2020 16:24:11 -------------------------------------------------------------------------------- Clinic Level of Care Assessment Details Patient Name: Date of Service: Samantha Duncan 03/27/2020 4:00 PM Medical Record Number: [id number] Patient Account Number: [id number] Date of Birth/Sex: Treating RN: 1955/11/21 (64 y.o. Elam Dutch Primary Care Provider: Lorene Dy Other Clinician: Referring Provider: Treating Provider/Extender: Yehuda Savannah in Treatment: 3 Clinic Level of Care Assessment Items TOOL 4 Quantity Score [] - 0 Use when only  an EandM is performed on FOLLOW-UP visit ASSESSMENTS - Nursing Assessment / Reassessment X- 1 10 Reassessment of Co-morbidities (includes updates in patient status) X- 1 5 Reassessment of Adherence to Treatment Plan ASSESSMENTS - Wound and Skin A ssessment / Reassessment [] - 0 Simple Wound Assessment / Reassessment - one wound X- 2 5 Complex Wound Assessment / Reassessment - multiple wounds [] - 0 Dermatologic / Skin Assessment (not related to wound area) ASSESSMENTS - Focused Assessment [] - 0 Circumferential Edema Measurements - multi extremities [] - 0 Nutritional Assessment / Counseling / Intervention X- 1 5 Lower Extremity Assessment (monofilament, tuning fork, pulses) [] - 0 Peripheral Arterial Disease Assessment (using hand held doppler) ASSESSMENTS - Ostomy and/or Continence Assessment and Care [] - 0 Incontinence Assessment and Management [] - 0 Ostomy Care Assessment and Management (repouching, etc.) PROCESS - Coordination of Care X - Simple Patient / Family Education for ongoing care 1 15 [] - 0 Complex (extensive) Patient / Family Education for ongoing care X- 1 10 Staff obtains Programmer, systems, Records, T Results / Process Orders est X- 1 10 Staff telephones HHA, Nursing Homes / Clarify orders / etc [] - 0 Routine Transfer to another Facility (non-emergent condition) [] - 0 Routine Hospital Admission (non-emergent condition) [] - 0 New Admissions / Biomedical engineer / Ordering NPWT Apligraf, etc. , [] - 0 Emergency Hospital Admission (emergent condition) X- 1 10 Simple Discharge Coordination [] - 0 Complex (extensive) Discharge Coordination PROCESS - Special Needs [] - 0 Pediatric / Minor Patient Management [] - 0 Isolation Patient Management [] - 0 Hearing / Language / Visual special needs [] - 0 Assessment of Community assistance (transportation, D/C planning, etc.) [] - 0 Additional assistance / Altered mentation [] - 0 Support Surface(s)  Assessment (bed, cushion, seat, etc.) INTERVENTIONS - Wound Cleansing / Measurement [] - 0 Simple Wound Cleansing - one wound X- 2 5 Complex Wound Cleansing - multiple  wounds X- 1 5 Wound Imaging (photographs - any number of wounds) [] - 0 Wound Tracing (instead of photographs) [] - 0 Simple Wound Measurement - one wound X- 2 5 Complex Wound Measurement - multiple wounds INTERVENTIONS - Wound Dressings X - Small Wound Dressing one or multiple wounds 1 10 [] - 0 Medium Wound Dressing one or multiple wounds [] - 0 Large Wound Dressing one or multiple wounds X- 1 5 Application of Medications - topical [] - 0 Application of Medications - injection INTERVENTIONS - Miscellaneous [] - 0 External ear exam [] - 0 Specimen Collection (cultures, biopsies, blood, body fluids, etc.) [] - 0 Specimen(s) / Culture(s) sent or taken to Lab for analysis [] - 0 Patient Transfer (multiple staff / Civil Service fast streamer / Similar devices) [] - 0 Simple Staple / Suture removal (25 or less) [] - 0 Complex Staple / Suture removal (26 or more) [] - 0 Hypo / Hyperglycemic Management (close monitor of Blood Glucose) [] - 0 Ankle / Brachial Index (ABI) - do not check if billed separately X- 1 5 Vital Signs Has the patient been seen at the hospital within the last three years: Yes Total Score: 120 Level Of Care: New/Established - Level 4 Electronic Signature(s) Signed: 03/27/2020 5:51:45 PM By: Baruch Gouty RN, BSN Entered By: Baruch Gouty on 03/27/2020 16:57:38 -------------------------------------------------------------------------------- Encounter Discharge Information Details Patient Name: Date of Service: Samantha Slates R. 03/27/2020 4:00 PM Medical Record Number: [id number] Patient Account Number: [id number] Date of Birth/Sex: Treating RN: 10/08/1955 (64 y.o. Nancy Fetter Primary Care Daniela Siebers: Lorene Dy Other Clinician: Referring Florentine Diekman: Treating Darick Fetters/Extender: Yehuda Savannah in Treatment: 3 Encounter Discharge Information Items Discharge Condition: Stable Ambulatory Status: Wheelchair Discharge Destination: Home Transportation: Private Auto Accompanied By: son Schedule Follow-up Appointment: Yes Clinical Summary of Care: Patient Declined Electronic Signature(s) Signed: 03/28/2020 5:54:11 PM By: Levan Hurst RN, BSN Entered By: Levan Hurst on 03/27/2020 17:12:26 -------------------------------------------------------------------------------- Lower Extremity Assessment Details Patient Name: Date of Service: Samantha Duncan. 03/27/2020 4:00 PM Medical Record Number: [id number] Patient Account Number: [id number] Date of Birth/Sex: Treating RN: March 31, 1956 (64 y.o. Nancy Fetter Primary Care Malu Pellegrini: Lorene Dy Other Clinician: Referring Alyxandra Tenbrink: Treating Oline Belk/Extender: Yehuda Savannah in Treatment: 3 Edema Assessment Assessed: [Left: No] [Right: No] Edema: [Left: Ye] [Right: s] Calf Left: Right: Point of Measurement: From Medial Instep 25 cm Ankle Left: Right: Point of Measurement: From Medial Instep 21 cm Vascular Assessment Pulses: Dorsalis Pedis Palpable: [Left:Yes] Electronic Signature(s) Signed: 03/28/2020 5:54:11 PM By: Levan Hurst RN, BSN Entered By: Levan Hurst on 03/27/2020 16:34:16 -------------------------------------------------------------------------------- Multi-Disciplinary Care Plan Details Patient Name: Date of Service: Samantha Slates R. 03/27/2020 4:00 PM Medical Record Number: [id number] Patient Account Number: [id number] Date of Birth/Sex: Treating RN: 1956-04-26 (64 y.o. Elam Dutch Primary Care Teshawn Moan: Lorene Dy Other Clinician: Referring Gowri Suchan: Treating Undrea Shipes/Extender: Yehuda Savannah in Treatment: 3 Active Inactive Pain, Acute or Chronic Nursing Diagnoses: Pain, acute or chronic: actual or  potential Potential alteration in comfort, pain Goals: Patient will verbalize adequate pain control and receive pain control interventions during procedures as needed Date Initiated: 02/29/2020 Target Resolution Date: 04/24/2020 Goal Status: Active Patient/caregiver will verbalize comfort level met Date Initiated: 02/29/2020 Target Resolution Date: 04/24/2020 Goal Status: Active Interventions: Provide education on pain management Reposition patient for comfort Treatment Activities: Administer pain control measures as ordered : 02/29/2020 Notes: Pressure Nursing Diagnoses: Potential for impaired tissue integrity related to  pressure, friction, moisture, and shear Goals: Patient will remain free from development of additional pressure ulcers Date Initiated: 02/29/2020 Date Inactivated: 03/27/2020 Target Resolution Date: 03/29/2020 Goal Status: Met Patient/caregiver will verbalize understanding of pressure ulcer management Date Initiated: 02/29/2020 Target Resolution Date: 04/24/2020 Goal Status: Active Interventions: Assess: immobility, friction, shearing, incontinence upon admission and as needed Assess potential for pressure ulcer upon admission and as needed Provide education on pressure ulcers Treatment Activities: Patient referred for pressure reduction/relief devices : 02/29/2020 T ordered outside of clinic : 02/29/2020 est Notes: Wound/Skin Impairment Nursing Diagnoses: Knowledge deficit related to ulceration/compromised skin integrity Goals: Patient/caregiver will verbalize understanding of skin care regimen Date Initiated: 02/29/2020 Target Resolution Date: 04/24/2020 Goal Status: Active Interventions: Assess patient/caregiver ability to obtain necessary supplies Assess patient/caregiver ability to perform ulcer/skin care regimen upon admission and as needed Provide education on ulcer and skin care Treatment Activities: Skin care regimen initiated : 02/29/2020 Topical  wound management initiated : 02/29/2020 Notes: Electronic Signature(s) Signed: 03/27/2020 5:51:45 PM By: Baruch Gouty RN, BSN Entered By: Baruch Gouty on 03/27/2020 16:51:55 -------------------------------------------------------------------------------- Pain Assessment Details Patient Name: Date of Service: Samantha Slates R. 03/27/2020 4:00 PM Medical Record Number: [id number] Patient Account Number: [id number] Date of Birth/Sex: Treating RN: Jul 24, 1955 (64 y.o. Nancy Fetter Primary Care Weslie Rasmus: Lorene Dy Other Clinician: Referring Shashank Kwasnik: Treating Orvie Caradine/Extender: Yehuda Savannah in Treatment: 3 Active Problems Location of Pain Severity and Description of Pain Patient Has Paino No Site Locations Pain Management and Medication Current Pain Management: Electronic Signature(s) Signed: 03/28/2020 5:54:11 PM By: Levan Hurst RN, BSN Entered By: Levan Hurst on 03/27/2020 16:24:53 -------------------------------------------------------------------------------- Patient/Caregiver Education Details Patient Name: Date of Service: Samantha Duncan 11/3/2021andnbsp4:00 PM Medical Record Number: [id number] Patient Account Number: [id number] Date of Birth/Gender: Treating RN: 07-21-1955 (64 y.o. Elam Dutch Primary Care Physician: Lorene Dy Other Clinician: Referring Physician: Treating Physician/Extender: Yehuda Savannah in Treatment: 3 Education Assessment Education Provided To: Patient Education Topics Provided Pressure: Methods: Explain/Verbal Responses: Reinforcements needed, State content correctly Wound/Skin Impairment: Methods: Explain/Verbal Responses: Reinforcements needed, State content correctly Electronic Signature(s) Signed: 03/27/2020 5:51:45 PM By: Baruch Gouty RN, BSN Entered By: Baruch Gouty on 03/27/2020  16:52:17 -------------------------------------------------------------------------------- Wound Assessment Details Patient Name: Date of Service: Samantha Slates R. 03/27/2020 4:00 PM Medical Record Number: [id number] Patient Account Number: [id number] Date of Birth/Sex: Treating RN: 12-Jan-1956 (64 y.o. Nancy Fetter Primary Care Paytan Recine: Lorene Dy Other Clinician: Referring Ashaz Robling: Treating Marius Betts/Extender: Yehuda Savannah in Treatment: 3 Wound Status Wound Number: 1 Primary Etiology: Pressure Ulcer Wound Location: Left Calcaneus Wound Status: Open Wounding Event: Blister Comorbid History: Hypertension Date Acquired: 01/10/2020 Weeks Of Treatment: 3 Clustered Wound: No Wound Measurements Length: (cm) 2.3 Width: (cm) 3.9 Depth: (cm) 0.5 Area: (cm) 7.045 Volume: (cm) 3.523 % Reduction in Area: 14.9% % Reduction in Volume: -325.5% Epithelialization: Small (1-33%) Tunneling: No Undermining: No Wound Description Classification: Unstageable/Unclassified Wound Margin: Distinct, outline attached Exudate Amount: Medium Exudate Type: Serosanguineous Exudate Color: red, brown Foul Odor After Cleansing: No Slough/Fibrino Yes Wound Bed Granulation Amount: Medium (34-66%) Exposed Structure Granulation Quality: Pink Fascia Exposed: No Necrotic Amount: Medium (34-66%) Fat Layer (Subcutaneous Tissue) Exposed: Yes Necrotic Quality: Adherent Slough Tendon Exposed: No Muscle Exposed: No Joint Exposed: No Bone Exposed: No Treatment Notes Wound #1 (Left Calcaneus) 1. Cleanse With Wound Cleanser 3. Primary Dressing Applied Iodoflex 4. Secondary Dressing Dry Gauze Roll Gauze 5. Secured With Tape Notes netting / globoped offloading  shoe Electronic Signature(s) Signed: 03/28/2020 5:54:11 PM By: Levan Hurst RN, BSN Entered By: Levan Hurst on 03/27/2020  16:33:35 -------------------------------------------------------------------------------- Wound Assessment Details Patient Name: Date of Service: Samantha Slates R. 03/27/2020 4:00 PM Medical Record Number: [id number] Patient Account Number: [id number] Date of Birth/Sex: Treating RN: [birth year] (64 y.o. Elam Dutch Primary Care Wanda Rideout: Lorene Dy Other Clinician: Referring Jin Capote: Treating Crixus Mcaulay/Extender: Yehuda Savannah in Treatment: 3 Wound Status Wound Number: 2 Primary Etiology: Pressure Ulcer Wound Location: Left, Anterior Ankle Wound Status: Healed - Epithelialized Wounding Event: Blister Comorbid History: Hypertension Date Acquired: 01/10/2020 Weeks Of Treatment: 3 Clustered Wound: No Wound Measurements Length: (cm) Width: (cm) Depth: (cm) Area: (cm) Volume: (cm) 0 % Reduction in Area: 100% 0 % Reduction in Volume: 100% 0 Epithelialization: Large (67-100%) 0 Tunneling: No 0 Undermining: No Wound Description Classification: Category/Stage III Wound Margin: Distinct, outline attached Exudate Amount: Small Exudate Type: Serous Exudate Color: amber Foul Odor After Cleansing: No Slough/Fibrino No Wound Bed Granulation Amount: Large (67-100%) Exposed Structure Granulation Quality: Pink, Pale Fascia Exposed: No Necrotic Amount: None Present (0%) Fat Layer (Subcutaneous Tissue) Exposed: Yes Tendon Exposed: No Muscle Exposed: No Joint Exposed: No Bone Exposed: No Electronic Signature(s) Signed: 03/27/2020 5:51:45 PM By: Baruch Gouty RN, BSN Entered By: Baruch Gouty on 03/27/2020 16:53:52 -------------------------------------------------------------------------------- Vitals Details Patient Name: Date of Service: Samantha Slates R. 03/27/2020 4:00 PM Medical Record Number: [id number] Patient Account Number: [id number] Date of Birth/Sex: Treating RN: September 02, 1955 (64 y.o. Nancy Fetter Primary Care Keylee Shrestha:  Lorene Dy Other Clinician: Referring Arzella Rehmann: Treating Zuriel Yeaman/Extender: Yehuda Savannah in Treatment: 3 Vital Signs Time Taken: 16:24 Temperature (F): 98.7 Height (in): 61 Pulse (bpm): 59 Weight (lbs): 105 Respiratory Rate (breaths/min): 16 Body Mass Index (BMI): 19.8 Blood Pressure (mmHg): 157/72 Reference Range: 80 - 120 mg / dl Electronic Signature(s) Signed: 03/28/2020 5:54:11 PM By: Levan Hurst RN, BSN Entered By: Levan Hurst on 03/27/2020 16:24:42

## 2020-03-29 ENCOUNTER — Encounter (HOSPITAL_BASED_OUTPATIENT_CLINIC_OR_DEPARTMENT_OTHER): Payer: Medicare PPO | Admitting: Internal Medicine

## 2020-04-04 ENCOUNTER — Encounter (HOSPITAL_BASED_OUTPATIENT_CLINIC_OR_DEPARTMENT_OTHER): Payer: Medicare PPO | Admitting: Internal Medicine

## 2020-04-12 ENCOUNTER — Encounter (HOSPITAL_BASED_OUTPATIENT_CLINIC_OR_DEPARTMENT_OTHER): Payer: Medicare PPO | Admitting: Internal Medicine

## 2020-04-17 ENCOUNTER — Encounter (HOSPITAL_BASED_OUTPATIENT_CLINIC_OR_DEPARTMENT_OTHER): Payer: Medicare PPO | Admitting: Physician Assistant

## 2020-04-17 ENCOUNTER — Other Ambulatory Visit: Payer: Self-pay

## 2020-04-17 DIAGNOSIS — Z09 Encounter for follow-up examination after completed treatment for conditions other than malignant neoplasm: Secondary | ICD-10-CM | POA: Diagnosis not present

## 2020-04-17 NOTE — Progress Notes (Addendum)
Samantha Duncan, Samantha R. ([id number]) Visit Report for 04/17/2020 Chief Complaint Document Details Patient Name: Date of Service: Samantha Duncan, [name] 04/17/2020 3:15 PM Medical Record Number: [id number] Patient Account Number: [id number] Date of Birth/Sex: Treating RN: 15-May-1956 (64 y.o. Samantha Duncan) Boehlein, Linda Primary Care Provider: Burton Apleyoberts, Ronald Other Clinician: Referring Provider: Treating Provider/Extender: Iona BeardStone III, Abdoulie Tierce Roberts, Ronald Weeks in Treatment: 6 Information Obtained from: Patient Chief Complaint 02/29/2020; patient is here for review of a wound on her left heel and left dorsal foot/ankle Electronic Signature(s) Signed: 04/17/2020 3:49:02 PM By: Lenda KelpStone III, Kaileb Monsanto PA-C Entered By: Lenda KelpStone III, Zaccheaus Storlie on 04/17/2020 15:49:02 -------------------------------------------------------------------------------- HPI Details Patient Name: Date of Service: Samantha Duncan, [name] 04/17/2020 3:15 PM Medical Record Number: [id number] Patient Account Number: [id number] Date of Birth/Sex: Treating RN: 15-May-1956 (64 y.o. 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 Her problem started in August when she suffered a fall in the bathroom. She was in the ER on 01/13/2020 with CT scan showing nondisplaced sacral and pubic rami fractures. I do not think she was admitted but she represented with severe pain on 8/27 and arrangements were made for her to go to [facility]. At some point somebody put an Ace wrap on her left leg which may have been in the hospital. However the next time her son saw her the same Ace wrap was still in place now with hemorrhagic blisters over the anterior ankle and a black  eschared wound on the left heel. Based on a trip to the ER 01/30/2020 the heel looks like a deep tissue injury. The wrap had denuded skin on the left anterior ankle. An x-ray was negative. The patient is now back at home. She has a roommate and home health aides and her son. They do not have a lot of support by the sound of things. The patient originally walked with an AFO brace because of left foot drop but she has not been using that out of fear it would put pressure on the heel and I agree with that. However she is anxious to pursue physical therapy if that is possible. It was not really clear to our staff what they have been using on this wound although she does have home health. In further discussion I think it was Betadine back wet to dry dressings. On the anterior foot/ankle they have been using antibiotic ointments. ABI on the left was 0.92 03/07/2020; severe wrap injury as described above. There is an area on the dorsal aspect of her ankle at the crease with her foot as well as a more impressive area over the heel. The area on the ankle looks as though it is trying to close however the area on the heel still requires an extensive debridement of necrotic tissue fortunately there is been no evidence of infection. Using Iodoflex 10/21; the patient has 1 small open area on the dorsal aspect of her ankle. I think this is feeling in we have been using Iodosorb. The heel still has a very necrotic surface we have been using Iodoflex on this 03/27/2020 on evaluation today patient appears to be doing well in regard to her ulcers. The dorsal foot actually appears to be healed based on what I am seeing today. The heel is  improving with the Iodoflex I feel like this is doing a great job. 04/17/2020 on evaluation today patient appears to be doing well with regard to her foot ulcer on the heel. Fortunately there is no signs of active infection she is healing quite nicely which is great news. No fevers,  chills, nausea, vomiting, or diarrhea. Electronic Signature(s) Signed: 04/17/2020 4:21:10 PM By: Lenda Kelp PA-C Entered By: Lenda Kelp on 04/17/2020 16:21:10 -------------------------------------------------------------------------------- Physical Exam Details Patient Name: Date of Service: Samantha Duncan 04/17/2020 3:15 PM Medical Record Number: [id number] Patient Account Number: [id number] Date of Birth/Sex: Treating RN: [birth year] (64 y.o. Samantha Standard Primary Care Provider: Burton [name] Other Clinician: Referring Provider: Treating Provider/Extender: Iona Beard in Treatment: 6 Constitutional Well-nourished and well-hydrated in no acute distress. Respiratory normal breathing without difficulty. Psychiatric this patient is able to make decisions and demonstrates good insight into disease process. Alert and Oriented x 3. pleasant and cooperative. Notes Patient's wound bed currently showed signs of good granulation at this time. There does not appear to be any evidence of active infection which is great news. I did not have to perform sharp debridement today and overall the patient seems to be doing well. I think continuation with the Iodoflex is ideal. Electronic Signature(s) Signed: 04/17/2020 4:21:23 PM By: Lenda Kelp PA-C Entered By: Lenda Kelp on 04/17/2020 16:21:23 -------------------------------------------------------------------------------- Physician Orders Details Patient Name: Date of Service: Samantha Brothers. 04/17/2020 3:15 PM Medical Record Number: [id number] Patient Account Number: [id number] Date of Birth/Sex: Treating RN: 1955/12/13 (64 y.o. 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 Dressing Change Frequency Other: - [facility] twice a week on the week patient is not coming to wound center. [facility] once a week when patient has wound care appointment. Skin Barriers/Peri-Wound Care Moisturizing lotion - both legs. Wound Cleansing Clean wound with Wound Cleanser - with dressing changes and scrub gently with gauze to remove debris May shower with protection. - use cast protectors. Primary Wound Dressing Foam - to protect anterior ankle Wound #1 Left Calcaneus Iodoflex Secondary Dressing Wound #1 Left Calcaneus Kerlix/Rolled Gauze Dry Gauze Heel Cup Edema Control Elevate legs to the level of the heart or above for 30 minutes daily and/or when sitting, a frequency of: - throughout the day. Off-Loading Multipodus Splint to: - booty boot wear on left foot while resting in bed at night. Ensure to float left heel with pillow while in chair. no pressure to wound. Wedge shoe to: - patient to wear globoped healing sandal to aid transfer from chair to bed and with walking. Home Health Continue Home Health skilled nursing for wound care. [facility] twice a week on the week patient is not coming to wound center. [facility] once a week when patient has wound care appointment. Electronic Signature(s) Signed: 04/17/2020 5:04:05 PM By: Lenda Kelp PA-C Signed: 04/22/2020 5:13:46 PM By: Fonnie Mu RN Entered By: Fonnie Mu on 04/17/2020 15:55:00 -------------------------------------------------------------------------------- Problem List Details Patient Name: Date of Service: Samantha Brothers. 04/17/2020 3:15 PM Medical Record Number: [id number] Patient Account Number: [id number] Date of Birth/Sex: Treating  RN:  March 18, 1956 (64 y.o. Samantha Standard Primary Care Provider: Burton [name] Other Clinician: Referring Provider: Treating Provider/Extender: Iona Beard in Treatment: 6 Active Problems ICD-10 Encounter Code Description Active Date MDM Diagnosis L89.620 Pressure ulcer of left heel, unstageable 02/29/2020 No Yes L97.528 Non-pressure chronic ulcer of other part of left foot with other specified 02/29/2020 No Yes severity R26.0 Ataxic gait 02/29/2020 No Yes Inactive Problems Resolved Problems Electronic Signature(s) Signed: 04/17/2020 3:48:54 PM By: Lenda Kelp PA-C Entered By: Lenda Kelp on 04/17/2020 15:48:54 -------------------------------------------------------------------------------- Progress Note Details Patient Name: Date of Service: Samantha Brothers. 04/17/2020 3:15 PM Medical Record Number: [id number] Patient Account Number: [id number] Date of Birth/Sex: Treating RN: [birth year] (64 y.o. 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 Her problem started in August when she suffered a fall in the bathroom. She was in the ER on 01/13/2020 with CT scan showing nondisplaced sacral and pubic rami fractures. I do not think she was admitted but she represented with severe pain on 8/27 and arrangements were made for her to go to [facility]. At some point somebody put an Ace wrap on her left leg which may have been in the hospital. However the next time her son saw her the same Ace wrap was  still in place now with hemorrhagic blisters over the anterior ankle and a black eschared wound on the left heel. Based on a trip to the ER 01/30/2020 the heel looks like a deep tissue injury. The wrap had denuded skin on the left anterior ankle. An x-ray was negative. The patient is now back at home. She has a roommate and home health aides and her son. They do not have a lot of support by the sound of things. The patient originally walked with an AFO brace because of left foot drop but she has not been using that out of fear it would put pressure on the heel and I agree with that. However she is anxious to pursue physical therapy if that is possible. It was not really clear to our staff what they have been using on this wound although she does have home health. In further discussion I think it was Betadine back wet to dry dressings. On the anterior foot/ankle they have been using antibiotic ointments. ABI on the left was 0.92 03/07/2020; severe wrap injury as described above. There is an area on the dorsal aspect of her ankle at the crease with her foot as well as a more impressive area over the heel. The area on the ankle looks as though it is trying to close however the area on the heel still requires an extensive debridement of necrotic tissue fortunately there is been no evidence of infection. Using Iodoflex 10/21; the patient has 1 small open area on the dorsal aspect of her ankle. I think this is feeling in we have been using Iodosorb. The heel still has a very necrotic surface we have been using Iodoflex on this 03/27/2020 on evaluation today patient appears to be doing well in regard to her ulcers. The dorsal foot actually appears to be  healed based on what I am seeing today. The heel is improving with the Iodoflex I feel like this is doing a great job. 04/17/2020 on evaluation today patient appears to be doing well with regard to her foot ulcer on the heel. Fortunately there is no signs of  active infection she is healing quite nicely which is great news. No fevers, chills, nausea, vomiting, or diarrhea. Objective Constitutional Well-nourished and well-hydrated in no acute distress. Vitals Time Taken: 3:44 PM, Height: 61 in, Weight: 105 lbs, BMI: 19.8, Temperature: 98.6 F, Pulse: 56 bpm, Respiratory Rate: 20 breaths/min, Blood Pressure: 156/73 mmHg. Respiratory normal breathing without difficulty. Psychiatric this patient is able to make decisions and demonstrates good insight into disease process. Alert and Oriented x 3. pleasant and cooperative. General Notes: Patient's wound bed currently showed signs of good granulation at this time. There does not appear to be any evidence of active infection which is great news. I did not have to perform sharp debridement today and overall the patient seems to be doing well. I think continuation with the Iodoflex is ideal. Integumentary (Hair, Skin) Wound #1 status is Open. Original cause of wound was Blister. The wound is located on the Left Calcaneus. The wound measures 1.9cm length x 3.2cm width x 0.2cm depth; 4.775cm^2 area and 0.955cm^3 volume. There is Fat Layer (Subcutaneous Tissue) exposed. There is no tunneling or undermining noted. There is a medium amount of serosanguineous drainage noted. The wound margin is distinct with the outline attached to the wound base. There is medium (34-66%) pink granulation within the wound bed. There is a medium (34-66%) amount of necrotic tissue within the wound bed including Adherent Slough. 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 Dressing Change Frequency: Other: - [facility] twice a week on the week patient is not coming to wound center. [facility] once a week when patient has wound care  appointment. Skin Barriers/Peri-Wound Care: Moisturizing lotion - both legs. Wound Cleansing: Clean wound with Wound Cleanser - with dressing changes and scrub gently with gauze to remove debris May shower with protection. - use cast protectors. Primary Wound Dressing: Foam - to protect anterior ankle Wound #1 Left Calcaneus: Iodoflex Secondary Dressing: Wound #1 Left Calcaneus: Kerlix/Rolled Gauze Dry Gauze Heel Cup Edema Control: Elevate legs to the level of the heart or above for 30 minutes daily and/or when sitting, a frequency of: - throughout the day. Off-Loading: Multipodus Splint to: - booty boot wear on left foot while resting in bed at night. Ensure to float left heel with pillow while in chair. no pressure to wound. Wedge shoe to: - patient to wear globoped healing sandal to aid transfer from chair to bed and with walking. Home Health: Continue Home Health skilled nursing for wound care. [facility] twice a week on the week patient is not coming to wound center. [facility] once a week when patient has wound care appointment. 1. I recommend currently that we go ahead and continue with the wound care measures as before specifically with regard to the Iodoflex which seems to be doing a great job for the patient. 2. I am also can recommend at this time that we have the patient continue to offload her heels much as possible to try to keep pressure under good control. 3. I do think the patient can stand at  times intermittently but I would not recommend excessive standing in general to prevent anything from worsening in general. We will see patient back for reevaluation in 2 weeks here in the clinic. If anything worsens or changes patient will contact our office for additional recommendations. Electronic Signature(s) Signed: 04/17/2020 4:28:32 PM By: Lenda Kelp PA-C Entered By: Lenda Kelp on 04/17/2020  16:28:31 -------------------------------------------------------------------------------- SuperBill Details Patient Name: Date of Service: Samantha Brothers 04/17/2020 Medical Record Number: [id number] Patient Account Number: [id number] Date of Birth/Sex: Treating RN: Oct 04, 1955 (64 y.o. 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

## 2020-04-22 NOTE — Progress Notes (Signed)
Samantha Duncan, Samantha Duncan ([id number]) Visit Report for 04/17/2020 Arrival Information Details Patient Name: Date of Service: Samantha Duncan, Samantha Duncan 04/17/2020 3:15 PM Medical Record Number: [id number] Patient Account Number: [id number] Date of Birth/Sex: Treating RN: 27-Jan-1956 (64 y.o. Tonita Phoenix, Lauren Primary Care Draycen Leichter: Lorene Dy Other Clinician: Referring Lakisa Lotz: Treating Dezyre Hoefer/Extender: Yehuda Savannah in Treatment: 6 Visit Information History Since Last Visit Added or deleted any medications: No Patient Arrived: Wheel Chair Any new allergies or adverse reactions: No Arrival Time: 15:39 Had a fall or experienced change in No Accompanied By: son activities of daily living that may affect Transfer Assistance: None risk of falls: Patient Identification Verified: Yes Signs or symptoms of abuse/neglect since last visito No Secondary Verification Process Completed: Yes Hospitalized since last visit: No Patient Requires Transmission-Based Precautions: No Pain Present Now: Unable to Respond Patient Has Alerts: No Electronic Signature(s) Signed: 04/22/2020 5:13:46 PM By: Rhae Hammock RN Entered By: Rhae Hammock on 04/17/2020 15:40:04 -------------------------------------------------------------------------------- Clinic Level of Care Assessment Details Patient Name: Date of Service: Samantha Duncan, Samantha Duncan 04/17/2020 3:15 PM Medical Record Number: [id number] Patient Account Number: [id number] Date of Birth/Sex: Treating RN: March 01, 1956 (64 y.o. Tonita Phoenix, Lauren Primary Care Cheyenne Schumm: Lorene Dy Other Clinician: Referring Khalaya Mcgurn: Treating Eran Windish/Extender: Yehuda Savannah in Treatment: 6 Clinic Level of Care Assessment Items TOOL 4 Quantity Score X- 1 0 Use when only an EandM is performed on FOLLOW-UP visit ASSESSMENTS - Nursing Assessment / Reassessment X- 1 10 Reassessment of Co-morbidities (includes  updates in patient status) X- 1 5 Reassessment of Adherence to Treatment Plan ASSESSMENTS - Wound and Skin A ssessment / Reassessment X - Simple Wound Assessment / Reassessment - one wound 1 5 '[]'  - 0 Complex Wound Assessment / Reassessment - multiple wounds X- 1 10 Dermatologic / Skin Assessment (not related to wound area) ASSESSMENTS - Focused Assessment X- 1 5 Circumferential Edema Measurements - multi extremities '[]'  - 0 Nutritional Assessment / Counseling / Intervention X- 1 5 Lower Extremity Assessment (monofilament, tuning fork, pulses) '[]'  - 0 Peripheral Arterial Disease Assessment (using hand held doppler) ASSESSMENTS - Ostomy and/or Continence Assessment and Care '[]'  - 0 Incontinence Assessment and Management '[]'  - 0 Ostomy Care Assessment and Management (repouching, etc.) PROCESS - Coordination of Care X - Simple Patient / Family Education for ongoing care 1 15 '[]'  - 0 Complex (extensive) Patient / Family Education for ongoing care X- 1 10 Staff obtains Programmer, systems, Records, T Results / Process Orders est X- 1 10 Staff telephones HHA, Nursing Homes / Clarify orders / etc '[]'  - 0 Routine Transfer to another Facility (non-emergent condition) '[]'  - 0 Routine Hospital Admission (non-emergent condition) '[]'  - 0 New Admissions / Biomedical engineer / Ordering NPWT Apligraf, etc. , '[]'  - 0 Emergency Hospital Admission (emergent condition) X- 1 10 Simple Discharge Coordination '[]'  - 0 Complex (extensive) Discharge Coordination PROCESS - Special Needs '[]'  - 0 Pediatric / Minor Patient Management '[]'  - 0 Isolation Patient Management '[]'  - 0 Hearing / Language / Visual special needs '[]'  - 0 Assessment of Community assistance (transportation, D/C planning, etc.) '[]'  - 0 Additional assistance / Altered mentation '[]'  - 0 Support Surface(s) Assessment (bed, cushion, seat, etc.) INTERVENTIONS - Wound Cleansing / Measurement X - Simple Wound Cleansing - one wound 1 5 '[]'  -  0 Complex Wound Cleansing - multiple wounds X- 1 5 Wound Imaging (photographs - any number of wounds) '[]'  - 0 Wound Tracing (instead of photographs) X- 1 5  Simple Wound Measurement - one wound '[]'  - 0 Complex Wound Measurement - multiple wounds INTERVENTIONS - Wound Dressings X - Small Wound Dressing one or multiple wounds 1 10 '[]'  - 0 Medium Wound Dressing one or multiple wounds '[]'  - 0 Large Wound Dressing one or multiple wounds X- 1 5 Application of Medications - topical '[]'  - 0 Application of Medications - injection INTERVENTIONS - Miscellaneous '[]'  - 0 External ear exam '[]'  - 0 Specimen Collection (cultures, biopsies, blood, body fluids, etc.) '[]'  - 0 Specimen(s) / Culture(s) sent or taken to Lab for analysis '[]'  - 0 Patient Transfer (multiple staff / Civil Service fast streamer / Similar devices) '[]'  - 0 Simple Staple / Suture removal (25 or less) '[]'  - 0 Complex Staple / Suture removal (26 or more) '[]'  - 0 Hypo / Hyperglycemic Management (close monitor of Blood Glucose) '[]'  - 0 Ankle / Brachial Index (ABI) - do not check if billed separately X- 1 5 Vital Signs Has the patient been seen at the hospital within the last three years: Yes Total Score: 120 Level Of Care: New/Established - Level 4 Electronic Signature(s) Signed: 04/22/2020 5:13:46 PM By: Rhae Hammock RN Entered By: Rhae Hammock on 04/17/2020 15:56:38 -------------------------------------------------------------------------------- Encounter Discharge Information Details Patient Name: Date of Service: Samantha Ferries. 04/17/2020 3:15 PM Medical Record Number: [id number] Patient Account Number: [id number] Date of Birth/Sex: Treating RN: [birth year] (64 y.o. [name], Tammi Klippel Primary Care Aadam Zhen: Lorene Dy Other Clinician: Referring Karman Biswell: Treating Barclay Lennox/Extender: Yehuda Savannah in Treatment: 6 Encounter Discharge Information Items Discharge Condition: Stable Ambulatory  Status: Wheelchair Discharge Destination: Home Transportation: Private Auto Accompanied By: son Schedule Follow-up Appointment: Yes Clinical Summary of Care: Electronic Signature(s) Signed: 04/17/2020 4:29:16 PM By: Deon Pilling Entered By: Deon Pilling on 04/17/2020 16:03:17 -------------------------------------------------------------------------------- Lower Extremity Assessment Details Patient Name: Date of Service: Samantha Duncan, Samantha Duncan 04/17/2020 3:15 PM Medical Record Number: [id number] Patient Account Number: [id number] Date of Birth/Sex: Treating RN: 1955-09-19 (64 y.o. Tonita Phoenix, Lauren Primary Care Tameria Patti: Lorene Dy Other Clinician: Referring Eliakim Tendler: Treating Nester Bachus/Extender: Yehuda Savannah in Treatment: 6 Edema Assessment Assessed: [Left: No] [Right: No] Edema: [Left: Ye] [Right: s] Calf Left: Right: Point of Measurement: From Medial Instep 24.5 cm Ankle Left: Right: Point of Measurement: From Medial Instep 21 cm Vascular Assessment Pulses: Dorsalis Pedis Palpable: [Left:Yes] Posterior Tibial Palpable: [Right:Yes] Electronic Signature(s) Signed: 04/22/2020 5:13:46 PM By: Rhae Hammock RN Entered By: Rhae Hammock on 04/17/2020 15:40:55 -------------------------------------------------------------------------------- Multi-Disciplinary Care Plan Details Patient Name: Date of Service: Samantha Ferries. 04/17/2020 3:15 PM Medical Record Number: [id number] Patient Account Number: [id number] Date of Birth/Sex: Treating RN: 12-05-1955 (64 y.o. Tonita Phoenix, Lauren Primary Care Romulus Hanrahan: Lorene Dy Other Clinician: Referring Neosha Switalski: Treating Turkessa Ostrom/Extender: Yehuda Savannah in Treatment: 6 Active Inactive Pain, Acute or Chronic Nursing Diagnoses: Pain, acute or chronic: actual or potential Potential alteration in comfort, pain Goals: Patient will verbalize adequate pain  control and receive pain control interventions during procedures as needed Date Initiated: 02/29/2020 Target Resolution Date: 04/24/2020 Goal Status: Active Patient/caregiver will verbalize comfort level met Date Initiated: 02/29/2020 Target Resolution Date: 04/24/2020 Goal Status: Active Interventions: Provide education on pain management Reposition patient for comfort Treatment Activities: Administer pain control measures as ordered : 02/29/2020 Notes: Pressure Nursing Diagnoses: Potential for impaired tissue integrity related to pressure, friction, moisture, and shear Goals: Patient will remain free from development of additional pressure ulcers Date Initiated: 02/29/2020 Date Inactivated: 03/27/2020 Target Resolution Date:  03/29/2020 Goal Status: Met Patient/caregiver will verbalize understanding of pressure ulcer management Date Initiated: 02/29/2020 Target Resolution Date: 04/24/2020 Goal Status: Active Interventions: Assess: immobility, friction, shearing, incontinence upon admission and as needed Assess potential for pressure ulcer upon admission and as needed Provide education on pressure ulcers Treatment Activities: Patient referred for pressure reduction/relief devices : 02/29/2020 T ordered outside of clinic : 02/29/2020 est Notes: Wound/Skin Impairment Nursing Diagnoses: Knowledge deficit related to ulceration/compromised skin integrity Goals: Patient/caregiver will verbalize understanding of skin care regimen Date Initiated: 02/29/2020 Target Resolution Date: 04/24/2020 Goal Status: Active Interventions: Assess patient/caregiver ability to obtain necessary supplies Assess patient/caregiver ability to perform ulcer/skin care regimen upon admission and as needed Provide education on ulcer and skin care Treatment Activities: Skin care regimen initiated : 02/29/2020 Topical wound management initiated : 02/29/2020 Notes: Electronic Signature(s) Signed: 04/22/2020 5:13:46  PM By: Rhae Hammock RN Entered By: Rhae Hammock on 04/17/2020 15:53:04 -------------------------------------------------------------------------------- Pain Assessment Details Patient Name: Date of Service: Samantha Ferries. 04/17/2020 3:15 PM Medical Record Number: [id number] Patient Account Number: [id number] Date of Birth/Sex: Treating RN: 05-21-1956 (64 y.o. Tonita Phoenix, Lauren Primary Care Farra Nikolic: Lorene Dy Other Clinician: Referring Trygg Mantz: Treating Ravon Mortellaro/Extender: Yehuda Savannah in Treatment: 6 Active Problems Location of Pain Severity and Description of Pain Patient Has Paino No Site Locations Rate the pain. Current Pain Level: 0 Pain Management and Medication Current Pain Management: Electronic Signature(s) Signed: 04/22/2020 5:13:46 PM By: Rhae Hammock RN Entered By: Rhae Hammock on 04/17/2020 15:40:23 -------------------------------------------------------------------------------- Patient/Caregiver Education Details Patient Name: Date of Service: Samantha Ferries 11/24/2021andnbsp3:15 PM Medical Record Number: [id number] Patient Account Number: [id number] Date of Birth/Gender: Treating RN: 11-05-1955 (64 y.o. Tonita Phoenix, Lauren Primary Care Physician: Lorene Dy Other Clinician: Referring Physician: Treating Physician/Extender: Yehuda Savannah in Treatment: 6 Education Assessment Education Provided To: Patient Education Topics Provided Pressure: Methods: Explain/Verbal Responses: Reinforcements needed, State content correctly Wound/Skin Impairment: Methods: Explain/Verbal Responses: Reinforcements needed, State content correctly Electronic Signature(s) Signed: 04/22/2020 5:13:46 PM By: Rhae Hammock RN Entered By: Rhae Hammock on 04/17/2020 15:53:30 -------------------------------------------------------------------------------- Wound Assessment  Details Patient Name: Date of Service: Samantha Ferries. 04/17/2020 3:15 PM Medical Record Number: [id number] Patient Account Number: [id number] Date of Birth/Sex: Treating RN: [birth year] (64 y.o. Tonita Phoenix, Lauren Primary Care Arianni Gallego: Lorene Dy Other Clinician: Referring Adessa Primiano: Treating Deetya Drouillard/Extender: Yehuda Savannah in Treatment: 6 Wound Status Wound Number: 1 Primary Etiology: Pressure Ulcer Wound Location: Left Calcaneus Wound Status: Open Wounding Event: Blister Comorbid History: Hypertension Date Acquired: 01/10/2020 Weeks Of Treatment: 6 Clustered Wound: No Wound Measurements Length: (cm) 1.9 Width: (cm) 3.2 Depth: (cm) 0.2 Area: (cm) 4.775 Volume: (cm) 0.955 % Reduction in Area: 42.3% % Reduction in Volume: -15.3% Epithelialization: Small (1-33%) Tunneling: No Undermining: No Wound Description Classification: Unstageable/Unclassified Wound Margin: Distinct, outline attached Exudate Amount: Medium Exudate Type: Serosanguineous Exudate Color: red, brown Wound Bed Granulation Amount: Medium (34-66%) Granulation Quality: Pink Necrotic Amount: Medium (34-66%) Necrotic Quality: Adherent Slough Foul Odor After Cleansing: No Slough/Fibrino Yes Exposed Structure Fascia Exposed: No Fat Layer (Subcutaneous Tissue) Exposed: Yes Tendon Exposed: No Muscle Exposed: No Joint Exposed: No Bone Exposed: No Treatment Notes Wound #1 (Left Calcaneus) 1. Cleanse With Wound Cleanser 3. Primary Dressing Applied Iodoflex 4. Secondary Dressing Dry Gauze Roll Gauze Heel Cup 5. Secured With Medipore tape Notes netting / globoped offloading Designer, jewellery Signature(s) Signed: 04/22/2020 5:13:46 PM By: Rhae Hammock RN Entered By: Rhae Hammock on 04/17/2020 15:44:28 --------------------------------------------------------------------------------  Vitals Details Patient Name: Date of Service: Samantha Duncan, Samantha Duncan  04/17/2020 3:15 PM Medical Record Number: [id number] Patient Account Number: [id number] Date of Birth/Sex: Treating RN: May 05, 1956 (64 y.o. Tonita Phoenix, Lauren Primary Care Cythnia Osmun: Lorene Dy Other Clinician: Referring Trevon Strothers: Treating Rylend Pietrzak/Extender: Yehuda Savannah in Treatment: 6 Vital Signs Time Taken: 15:44 Temperature (F): 98.6 Height (in): 61 Pulse (bpm): 56 Weight (lbs): 105 Respiratory Rate (breaths/min): 20 Body Mass Index (BMI): 19.8 Blood Pressure (mmHg): 156/73 Reference Range: 80 - 120 mg / dl Electronic Signature(s) Signed: 04/22/2020 5:13:46 PM By: Rhae Hammock RN Entered By: Rhae Hammock on 04/17/2020 15:47:29

## 2020-05-01 ENCOUNTER — Other Ambulatory Visit: Payer: Self-pay

## 2020-05-01 ENCOUNTER — Encounter (HOSPITAL_BASED_OUTPATIENT_CLINIC_OR_DEPARTMENT_OTHER): Payer: Medicare PPO | Attending: Physician Assistant | Admitting: Physician Assistant

## 2020-05-01 DIAGNOSIS — L97528 Non-pressure chronic ulcer of other part of left foot with other specified severity: Secondary | ICD-10-CM | POA: Diagnosis not present

## 2020-05-01 DIAGNOSIS — L89620 Pressure ulcer of left heel, unstageable: Secondary | ICD-10-CM | POA: Diagnosis not present

## 2020-05-01 DIAGNOSIS — R260 Ataxic gait: Secondary | ICD-10-CM | POA: Diagnosis not present

## 2020-05-01 DIAGNOSIS — I6930 Unspecified sequelae of cerebral infarction: Secondary | ICD-10-CM | POA: Diagnosis not present

## 2020-05-01 NOTE — Progress Notes (Addendum)
Samantha Duncan ([id number]) Visit Report for 05/01/2020 Chief Complaint Document Details Patient Name: Date of Service: Samantha Duncan 05/01/2020 3:30 PM Medical Record Number: [id number] Patient Account Number: [id number] Date of Birth/Sex: Treating RN: December 10, 1955 (64 y.o. Samantha Duncan Primary Care Provider: Burton Duncan Other Clinician: Referring Provider: Treating Provider/Extender: Samantha Duncan in Treatment: 8 Information Obtained from: Patient Chief Complaint 02/29/2020; patient is here for review of a wound on her left heel and left dorsal foot/ankle Electronic Signature(s) Signed: 05/01/2020 3:43:19 PM By: Samantha Kelp Duncan Entered By: Samantha Kelp on 05/01/2020 15:43:19 -------------------------------------------------------------------------------- HPI Details Patient Name: Date of Service: Samantha Duncan. 05/01/2020 3:30 PM Medical Record Number: [id number] Patient Account Number: [id number] Date of Birth/Sex: Treating RN: 12-01-1955 (64 y.o. 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 Her problem started in August when she suffered a fall in the bathroom. She was in the ER on 01/13/2020 with CT scan showing nondisplaced sacral and pubic rami fractures. I do not think she was admitted but she represented with severe pain on 8/27 and arrangements were made for her to go to [facility]. At some point somebody put an Ace wrap on her left leg which may have been in the hospital. However the next time her son saw her the same Ace wrap was still in place now with hemorrhagic blisters over the anterior ankle and a black  eschared wound on the left heel. Based on a trip to the ER 01/30/2020 the heel looks like a deep tissue injury. The wrap had denuded skin on the left anterior ankle. An x-ray was negative. The patient is now back at home. She has a roommate and home health aides and her son. They do not have a lot of support by the sound of things. The patient originally walked with an AFO brace because of left foot drop but she has not been using that out of fear it would put pressure on the heel and I agree with that. However she is anxious to pursue physical therapy if that is possible. It was not really clear to our staff what they have been using on this wound although she does have home health. In further discussion I think it was Betadine back wet to dry dressings. On the anterior foot/ankle they have been using antibiotic ointments. ABI on the left was 0.92 03/07/2020; severe wrap injury as described above. There is an area on the dorsal aspect of her ankle at the crease with her foot as well as a more impressive area over the heel. The area on the ankle looks as though it is trying to close however the area on the heel still requires an extensive debridement of necrotic tissue fortunately there is been no evidence of infection. Using Iodoflex 10/21; the patient has 1 small open area on the dorsal aspect of her ankle. I think this is feeling in we have been using Iodosorb. The heel still has a very necrotic surface we have been using Iodoflex on this 03/27/2020 on evaluation today patient appears to be doing well in regard to her ulcers. The dorsal foot actually appears to be healed based on what I am seeing today. The heel is  improving with the Iodoflex I feel like this is doing a great job. 04/17/2020 on evaluation today patient appears to be doing well with regard to her foot ulcer on the heel. Fortunately there is no signs of active infection she is healing quite nicely which is great news. No fevers,  chills, nausea, vomiting, or diarrhea. 05/01/2020 on evaluation today patient appears to actually be doing quite well in regard to her heel ulcer. I do believe the Iodoflex is doing a great job for her. There does not appear to be any signs of active infection at this time. Electronic Signature(s) Signed: 05/01/2020 4:50:03 PM By: Samantha Duncan, Samantha Duncan Entered By: Samantha Duncan, Samantha Duncan on 05/01/2020 16:50:03 -------------------------------------------------------------------------------- Physical Exam Details Patient Name: Date of Service: Samantha BrothersURE, [name] 05/01/2020 3:30 PM Medical Record Number: [id number] Patient Account Number: [id number] Date of Birth/Sex: Treating RN: February 06, 1956 (64 y.o. Samantha StandardF) Samantha Duncan Primary Care Provider: Burton Apleyoberts, Duncan Other Clinician: Referring Provider: Treating Provider/Extender: Samantha Duncan, Jayen Bromwell Samantha Duncan Weeks in Treatment: 8 Constitutional Well-nourished and well-hydrated in no acute distress. Respiratory normal breathing without difficulty. Psychiatric this patient is able to make decisions and demonstrates good insight into disease process. Alert and Oriented x 3. pleasant and cooperative. Notes Patient's wound bed showed signs of good granulation and epithelization I do believe that she is tolerating the Iodoflex I was able to clean off most of the slough from the surface of the wound today without complication and post debridement wound bed appears to be doing better I do not think she is quite ready to [facility] but I think we are very close. Electronic Signature(s) Signed: 05/01/2020 4:50:27 PM By: Samantha Duncan, Samantha Soler Duncan Entered By: Samantha Duncan, Samantha Duncan on 05/01/2020 16:50:27 -------------------------------------------------------------------------------- Physician Orders Details Patient Name: Date of Service: Samantha BrothersEURE, [name] 05/01/2020 3:30 PM Medical Record Number: [id number] Patient Account Number: [id number] Date of Birth/Sex:  Treating RN: February 06, 1956 (64 y.o. 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 Bathing/ Shower/ Hygiene May shower with protection but do not get wound dressing(s) wet. Off-Loading Turn and reposition every 2 hours Other: - avoid pressure to back of heels, float heels with pillows under calves while in bed Home Health No change in wound care orders this week; continue Home Health for wound care. May utilize formulary equivalent dressing for wound treatment orders unless otherwise specified. Other Home Health Orders/Instructions: - [facility] Wound Treatment Wound #1 - Calcaneus Wound Laterality: Left Cleanser: Normal Saline (Home Health) 3 x Per Week/30 Days Discharge Instructions: Cleanse the wound with Normal Saline prior to applying a clean dressing using gauze sponges, not tissue or cotton balls. Prim Dressing: IODOFLEX 0.9% Cadexomer Iodine Pad 4x6 cm (Home Health) 3 x Per Week/30 Days ary Discharge Instructions: Apply to wound bed as instructed Secondary Dressing: Woven Gauze Sponge, Non-Sterile 4x4 in (Home Health) 3 x Per Week/30 Days Discharge Instructions: Apply over primary dressing as directed. Secured With: [employer] Roll Sterile, 4.5x3.1 (in/yd) (Home Health) 3 x Per Week/30 Days Discharge Instructions: Secure with Kerlix , start wrapping at base of toes to help with foot edema Electronic Signature(s) Signed: 05/01/2020 6:12:31 PM By: Zenaida DeedBoehlein, Linda RN, BSN  Signed: 05/02/2020 1:30:16 PM By: Samantha Kelp Duncan Entered By: Zenaida Deed on 05/01/2020  16:51:22 -------------------------------------------------------------------------------- Problem List Details Patient Name: Date of Service: Samantha Duncan. 05/01/2020 3:30 PM Medical Record Number: [id number] Patient Account Number: [id number] Date of Birth/Sex: Treating RN: 1956/02/09 (64 y.o. 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 05/01/2020 3:30 PM Medical Record Number: [id number] Patient Account Number: [id number] Date of Birth/Sex: Treating RN: 16-Jul-1955 (64 y.o. 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 Her problem started in August when she suffered a fall in the bathroom. She was in the ER on 01/13/2020 with CT scan showing nondisplaced sacral and pubic rami fractures. I do not think  she was admitted but she represented with severe pain on 8/27 and arrangements were made for her to go to [facility]. At some point somebody put an Ace wrap on her left leg which may have been in the hospital. However the next time her son saw her the same Ace wrap was still in place now with hemorrhagic blisters over the anterior ankle and a black eschared wound on the left heel. Based on a trip to the ER 01/30/2020 the heel looks like a deep tissue injury. The wrap had denuded skin on the left anterior ankle. An x-ray was negative. The patient is now back at home. She has a roommate and home health aides and her son. They do not have a lot of support by the sound of things. The patient originally walked with an AFO brace because of left foot drop but she has not been using that out of fear it would put pressure on the heel and I agree with that. However she is anxious to pursue physical therapy if that is possible. It was not really clear to our staff what they have been using on this wound although she does have home health. In further discussion I think it was Betadine back wet to dry dressings. On the anterior foot/ankle they have been using antibiotic ointments. ABI on the left was 0.92 03/07/2020; severe wrap injury as described above. There is an area on the dorsal aspect of her ankle at the crease with her foot as well as a more impressive area over the heel. The area on the ankle looks as though it is trying to close however the area on the heel still requires an extensive debridement of necrotic tissue fortunately there is been no evidence of infection. Using Iodoflex 10/21; the patient has 1 small open area on the dorsal aspect of her ankle. I think  this is feeling in we have been using Iodosorb. The heel still has a very necrotic surface we have been using Iodoflex on this 03/27/2020 on evaluation today patient appears to be doing well in regard to her ulcers. The dorsal foot  actually appears to be healed based on what I am seeing today. The heel is improving with the Iodoflex I feel like this is doing a great job. 04/17/2020 on evaluation today patient appears to be doing well with regard to her foot ulcer on the heel. Fortunately there is no signs of active infection she is healing quite nicely which is great news. No fevers, chills, nausea, vomiting, or diarrhea. 05/01/2020 on evaluation today patient appears to actually be doing quite well in regard to her heel ulcer. I do believe the Iodoflex is doing a great job for her. There does not appear to be any signs of active infection at this time. Objective Constitutional Well-nourished and well-hydrated in no acute distress. Vitals Time Taken: 2:07 PM, Height: 61 in, Weight: 105 lbs, BMI: 19.8, Temperature: 98.2 F, Pulse: 67 bpm, Respiratory Rate: 20 breaths/min, Blood Pressure: 143/73 mmHg. Respiratory normal breathing without difficulty. Psychiatric this patient is able to make decisions and demonstrates good insight into disease process. Alert and Oriented x 3. pleasant and cooperative. General Notes: Patient's wound bed showed signs of good granulation and epithelization I do believe that she is tolerating the Iodoflex I was able to clean off most of the slough from the surface of the wound today without complication and post debridement wound bed appears to be doing better I do not think she is quite ready to switch to [facility] but I think we are very close. Integumentary (Hair, Skin) Wound #1 status is Open. Original cause of wound was Blister. The wound is located on the Left Calcaneus. The wound measures 1.3cm length x 3cm width x 0.3cm depth; 3.063cm^2 area and 0.919cm^3 volume. There is Fat Layer (Subcutaneous Tissue) exposed. There is no tunneling or undermining noted. There is a medium amount of serosanguineous drainage noted. The wound margin is distinct with the outline attached to the wound  base. There is medium (34-66%) pink granulation within the wound bed. There is a medium (34-66%) amount of necrotic tissue within the wound bed including Adherent Slough. 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 I would recommend currently that we going continue with the Iodoflex dressing the patient is in agreement the plan. 2. Also can recommend currently that we have the patient continue to monitor for any signs of worsening infection in general. Obviously right now I see no evidence of this currently. 3. I would also recommend she continue with appropriate offloading I think she does a great job with this. We will see patient back for reevaluation in 2 weeks here in the clinic. If anything worsens or changes patient will contact our office for additional recommendations. Electronic Signature(s) Signed: 05/01/2020 4:51:06 PM By: Samantha Kelp Duncan Entered By: Samantha Kelp on 05/01/2020 16:51:06 -------------------------------------------------------------------------------- SuperBill Details Patient Name: Date of Service: Samantha Duncan 05/01/2020 Medical Record Number: [id number] Patient Account Number: [id number] Date of Birth/Sex: Treating RN: [birth year] (64 y.o. 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

## 2020-05-02 NOTE — Progress Notes (Signed)
Samantha Duncan, Samantha Duncan ([id number]) Visit Report for 05/01/2020 Arrival Information Details Patient Name: Date of Service: Samantha Duncan, Samantha Duncan 05/01/2020 3:30 PM Medical Record Number: [id number] Patient Account Number: [id number] Date of Birth/Sex: Treating RN: 1955-07-07 (64 y.o. Tonita Phoenix, Lauren Primary Care Winnifred Dufford: Lorene Dy Other Clinician: Referring Michaelah Credeur: Treating Alaze Garverick/Extender: Yehuda Savannah in Treatment: 8 Visit Information History Since Last Visit Added or deleted any medications: No Patient Arrived: Wheel Chair Any new allergies or adverse reactions: No Arrival Time: 16:07 Had a fall or experienced change in No Accompanied By: son activities of daily living that may affect Transfer Assistance: None risk of falls: Patient Identification Verified: Yes Signs or symptoms of abuse/neglect since last visito No Secondary Verification Process Completed: Yes Hospitalized since last visit: No Patient Requires Transmission-Based Precautions: No Implantable device outside of the clinic excluding No Patient Has Alerts: No cellular tissue based products placed in the center since last visit: Has Dressing in Place as Prescribed: Yes Pain Present Now: No Electronic Signature(s) Signed: 05/02/2020 3:27:39 PM By: Rhae Hammock RN Entered By: Rhae Hammock on 05/01/2020 16:07:29 -------------------------------------------------------------------------------- Clinic Level of Care Assessment Details Patient Name: Date of Service: Samantha Duncan, Samantha Duncan 05/01/2020 3:30 PM Medical Record Number: [id number] Patient Account Number: [id number] Date of Birth/Sex: Treating RN: February 01, 1956 (64 y.o. Elam Dutch Primary Care Kharlie Bring: Lorene Dy Other Clinician: Referring Savvy Peeters: Treating Tuff Clabo/Extender: Yehuda Savannah in Treatment: 8 Clinic Level of Care Assessment Items TOOL 4 Quantity Score '[]'  - 0 Use when  only an EandM is performed on FOLLOW-UP visit ASSESSMENTS - Nursing Assessment / Reassessment X- 1 10 Reassessment of Co-morbidities (includes updates in patient status) X- 1 5 Reassessment of Adherence to Treatment Plan ASSESSMENTS - Wound and Skin A ssessment / Reassessment X - Simple Wound Assessment / Reassessment - one wound 1 5 '[]'  - 0 Complex Wound Assessment / Reassessment - multiple wounds '[]'  - 0 Dermatologic / Skin Assessment (not related to wound area) ASSESSMENTS - Focused Assessment '[]'  - 0 Circumferential Edema Measurements - multi extremities '[]'  - 0 Nutritional Assessment / Counseling / Intervention X- 1 5 Lower Extremity Assessment (monofilament, tuning fork, pulses) '[]'  - 0 Peripheral Arterial Disease Assessment (using hand held doppler) ASSESSMENTS - Ostomy and/or Continence Assessment and Care '[]'  - 0 Incontinence Assessment and Management '[]'  - 0 Ostomy Care Assessment and Management (repouching, etc.) PROCESS - Coordination of Care X - Simple Patient / Family Education for ongoing care 1 15 '[]'  - 0 Complex (extensive) Patient / Family Education for ongoing care X- 1 10 Staff obtains Programmer, systems, Records, T Results / Process Orders est '[]'  - 0 Staff telephones HHA, Nursing Homes / Clarify orders / etc '[]'  - 0 Routine Transfer to another Facility (non-emergent condition) '[]'  - 0 Routine Hospital Admission (non-emergent condition) '[]'  - 0 New Admissions / Biomedical engineer / Ordering NPWT Apligraf, etc. , '[]'  - 0 Emergency Hospital Admission (emergent condition) X- 1 10 Simple Discharge Coordination '[]'  - 0 Complex (extensive) Discharge Coordination PROCESS - Special Needs '[]'  - 0 Pediatric / Minor Patient Management '[]'  - 0 Isolation Patient Management '[]'  - 0 Hearing / Language / Visual special needs '[]'  - 0 Assessment of Community assistance (transportation, D/C planning, etc.) '[]'  - 0 Additional assistance / Altered mentation '[]'  - 0 Support  Surface(s) Assessment (bed, cushion, seat, etc.) INTERVENTIONS - Wound Cleansing / Measurement X - Simple Wound Cleansing - one wound 1 5 '[]'  - 0 Complex Wound Cleansing -  multiple wounds X- 1 5 Wound Imaging (photographs - any number of wounds) '[]'  - 0 Wound Tracing (instead of photographs) X- 1 5 Simple Wound Measurement - one wound '[]'  - 0 Complex Wound Measurement - multiple wounds INTERVENTIONS - Wound Dressings X - Small Wound Dressing one or multiple wounds 1 10 '[]'  - 0 Medium Wound Dressing one or multiple wounds '[]'  - 0 Large Wound Dressing one or multiple wounds X- 1 5 Application of Medications - topical '[]'  - 0 Application of Medications - injection INTERVENTIONS - Miscellaneous '[]'  - 0 External ear exam '[]'  - 0 Specimen Collection (cultures, biopsies, blood, body fluids, etc.) '[]'  - 0 Specimen(s) / Culture(s) sent or taken to Lab for analysis '[]'  - 0 Patient Transfer (multiple staff / Civil Service fast streamer / Similar devices) '[]'  - 0 Simple Staple / Suture removal (25 or less) '[]'  - 0 Complex Staple / Suture removal (26 or more) '[]'  - 0 Hypo / Hyperglycemic Management (close monitor of Blood Glucose) '[]'  - 0 Ankle / Brachial Index (ABI) - do not check if billed separately X- 1 5 Vital Signs Has the patient been seen at the hospital within the last three years: Yes Total Score: 95 Level Of Care: New/Established - Level 3 Electronic Signature(s) Signed: 05/01/2020 6:12:31 PM By: Baruch Gouty RN, BSN Entered By: Baruch Gouty on 05/01/2020 16:44:13 -------------------------------------------------------------------------------- Encounter Discharge Information Details Patient Name: Date of Service: Samantha Duncan. 05/01/2020 3:30 PM Medical Record Number: [id number] Patient Account Number: [id number] Date of Birth/Sex: Treating RN: 07/16/1955 (64 y.o. Tonita Phoenix, Lauren Primary Care Anysa Tacey: Lorene Dy Other Clinician: Referring Idamae Coccia: Treating  Saoirse Legere/Extender: Yehuda Savannah in Treatment: 8 Encounter Discharge Information Items Discharge Condition: Stable Ambulatory Status: Wheelchair Discharge Destination: Home Transportation: Private Auto Accompanied By: self Schedule Follow-up Appointment: Yes Clinical Summary of Care: Patient Declined Electronic Signature(s) Signed: 05/02/2020 3:27:39 PM By: Rhae Hammock RN Entered By: Rhae Hammock on 05/01/2020 16:57:22 -------------------------------------------------------------------------------- Lower Extremity Assessment Details Patient Name: Date of Service: Samantha Duncan 05/01/2020 3:30 PM Medical Record Number: [id number] Patient Account Number: [id number] Date of Birth/Sex: Treating RN: 30-Jul-1955 (64 y.o. Tonita Phoenix, Lauren Primary Care Abeeha Twist: Lorene Dy Other Clinician: Referring Terrez Ander: Treating Evette Diclemente/Extender: Yehuda Savannah in Treatment: 8 Edema Assessment Assessed: [Left: No] [Right: No] Edema: [Left: Ye] [Right: s] Calf Left: Right: Point of Measurement: From Medial Instep 26 cm Ankle Left: Right: Point of Measurement: From Medial Instep 19 cm Vascular Assessment Pulses: Dorsalis Pedis Palpable: [Left:Yes] Posterior Tibial Palpable: [Left:Yes] Electronic Signature(s) Signed: 05/02/2020 3:27:39 PM By: Rhae Hammock RN Entered By: Rhae Hammock on 05/01/2020 16:13:41 -------------------------------------------------------------------------------- [place] Details Patient Name: Date of Service: Samantha Duncan. 05/01/2020 3:30 PM Medical Record Number: [id number] Patient Account Number: [id number] Date of Birth/Sex: Treating RN: Aug 14, 1955 (64 y.o. Elam Dutch Primary Care Kissy Cielo: Lorene Dy Other Clinician: Referring Bao Coreas: Treating Shaquoia Miers/Extender: Yehuda Savannah in Treatment: 8 Active  Inactive Pressure Nursing Diagnoses: Potential for impaired tissue integrity related to pressure, friction, moisture, and shear Goals: Patient will remain free from development of additional pressure ulcers Date Initiated: 02/29/2020 Date Inactivated: 03/27/2020 Target Resolution Date: 03/29/2020 Goal Status: Met Patient/caregiver will verbalize understanding of pressure ulcer management Date Initiated: 02/29/2020 Target Resolution Date: 05/22/2020 Goal Status: Active Interventions: Assess: immobility, friction, shearing, incontinence upon admission and as needed Assess potential for pressure ulcer upon admission and as needed Provide education on pressure ulcers Treatment Activities: Patient referred for  pressure reduction/relief devices : 02/29/2020 T ordered outside of clinic : 02/29/2020 est Notes: Wound/Skin Impairment Nursing Diagnoses: Knowledge deficit related to ulceration/compromised skin integrity Goals: Patient/caregiver will verbalize understanding of skin care regimen Date Initiated: 02/29/2020 Target Resolution Date: 05/22/2020 Goal Status: Active Interventions: Assess patient/caregiver ability to obtain necessary supplies Assess patient/caregiver ability to perform ulcer/skin care regimen upon admission and as needed Provide education on ulcer and skin care Treatment Activities: Skin care regimen initiated : 02/29/2020 Topical wound management initiated : 02/29/2020 Notes: Electronic Signature(s) Signed: 05/01/2020 6:12:31 PM By: Baruch Gouty RN, BSN Entered By: Baruch Gouty on 05/01/2020 16:42:45 -------------------------------------------------------------------------------- Pain Assessment Details Patient Name: Date of Service: Samantha Duncan. 05/01/2020 3:30 PM Medical Record Number: [id number] Patient Account Number: [id number] Date of Birth/Sex: Treating RN: 1955-12-03 (64 y.o. Tonita Phoenix, Lauren Primary Care Mylena Sedberry: Lorene Dy Other  Clinician: Referring Natahlia Hoggard: Treating Lashone Stauber/Extender: Yehuda Savannah in Treatment: 8 Active Problems Location of Pain Severity and Description of Pain Patient Has Paino No Site Locations Rate the pain. Current Pain Level: 0 Pain Management and Medication Current Pain Management: Electronic Signature(s) Signed: 05/02/2020 3:27:39 PM By: Rhae Hammock RN Entered By: Rhae Hammock on 05/01/2020 16:08:18 -------------------------------------------------------------------------------- Patient/Caregiver Education Details Patient Name: Date of Service: Samantha Duncan 12/8/2021andnbsp3:30 PM Medical Record Number: [id number] Patient Account Number: [id number] Date of Birth/Gender: Treating RN: 29-Jul-1955 (64 y.o. Elam Dutch Primary Care Physician: Lorene Dy Other Clinician: Referring Physician: Treating Physician/Extender: Yehuda Savannah in Treatment: 8 Education Assessment Education Provided To: Patient Education Topics Provided Pressure: Methods: Explain/Verbal Responses: Reinforcements needed, State content correctly Wound/Skin Impairment: Methods: Explain/Verbal Responses: Reinforcements needed, State content correctly Electronic Signature(s) Signed: 05/01/2020 6:12:31 PM By: Baruch Gouty RN, BSN Entered By: Baruch Gouty on 05/01/2020 16:43:09 -------------------------------------------------------------------------------- Wound Assessment Details Patient Name: Date of Service: Samantha Duncan. 05/01/2020 3:30 PM Medical Record Number: [id number] Patient Account Number: [id number] Date of Birth/Sex: Treating RN: 1955/09/24 (64 y.o. Tonita Phoenix, Lauren Primary Care Laneta Guerin: Lorene Dy Other Clinician: Referring Oprah Camarena: Treating Demitria Hay/Extender: Yehuda Savannah in Treatment: 8 Wound Status Wound Number: 1 Primary Etiology: Pressure Ulcer Wound  Location: Left Calcaneus Wound Status: Open Wounding Event: Blister Comorbid History: Hypertension Date Acquired: 01/10/2020 Weeks Of Treatment: 8 Clustered Wound: No Wound Measurements Length: (cm) 1.3 Width: (cm) 3 Depth: (cm) 0.3 Area: (cm) 3.063 Volume: (cm) 0.919 % Reduction in Area: 63% % Reduction in Volume: -11% Epithelialization: Small (1-33%) Tunneling: No Undermining: No Wound Description Classification: Unstageable/Unclassified Wound Margin: Distinct, outline attached Exudate Amount: Medium Exudate Type: Serosanguineous Exudate Color: red, brown Foul Odor After Cleansing: No Slough/Fibrino Yes Wound Bed Granulation Amount: Medium (34-66%) Exposed Structure Granulation Quality: Pink Fascia Exposed: No Necrotic Amount: Medium (34-66%) Fat Layer (Subcutaneous Tissue) Exposed: Yes Necrotic Quality: Adherent Slough Tendon Exposed: No Muscle Exposed: No Joint Exposed: No Bone Exposed: No Treatment Notes Wound #1 (Calcaneus) Wound Laterality: Left Cleanser Normal Saline Discharge Instruction: Cleanse the wound with Normal Saline prior to applying a clean dressing using gauze sponges, not tissue or cotton balls. Peri-Wound Care Topical Primary Dressing IODOFLEX 0.9% Cadexomer Iodine Pad 4x6 cm Discharge Instruction: Apply to wound bed as instructed Secondary Dressing Woven Gauze Sponge, Non-Sterile 4x4 in Discharge Instruction: Apply over primary dressing as directed. Secured With [employer], 4.5x3.1 (in/yd) Discharge Instruction: Secure with Kerlix , start wrapping at base of toes to help with foot edema Compression Wrap Compression Stockings Add-Ons Electronic Signature(s) Signed: 05/02/2020 3:27:39 PM  By: Rhae Hammock RN Entered By: Rhae Hammock on 05/01/2020 16:12:31 -------------------------------------------------------------------------------- Vitals Details Patient Name: Date of Service: Samantha Duncan. 05/01/2020 3:30  PM Medical Record Number: [id number] Patient Account Number: [id number] Date of Birth/Sex: Treating RN: [birth year] (64 y.o. Tonita Phoenix, Lauren Primary Care Evalin Shawhan: Lorene Dy Other Clinician: Referring Turrell Severt: Treating Emilene Roma/Extender: Yehuda Savannah in Treatment: 8 Vital Signs Time Taken: 14:07 Temperature (F): 98.2 Height (in): 61 Pulse (bpm): 67 Weight (lbs): 105 Respiratory Rate (breaths/min): 20 Body Mass Index (BMI): 19.8 Blood Pressure (mmHg): 143/73 Reference Range: 80 - 120 mg / dl Electronic Signature(s) Signed: 05/02/2020 3:27:39 PM By: Rhae Hammock RN Entered By: Rhae Hammock on 05/01/2020 16:08:07

## 2020-05-15 ENCOUNTER — Encounter (HOSPITAL_BASED_OUTPATIENT_CLINIC_OR_DEPARTMENT_OTHER): Payer: Medicare PPO | Admitting: Physician Assistant

## 2020-05-22 ENCOUNTER — Encounter (HOSPITAL_BASED_OUTPATIENT_CLINIC_OR_DEPARTMENT_OTHER): Payer: Medicare PPO | Admitting: Physician Assistant

## 2020-05-30 ENCOUNTER — Encounter (HOSPITAL_BASED_OUTPATIENT_CLINIC_OR_DEPARTMENT_OTHER): Payer: Medicare PPO | Attending: Internal Medicine | Admitting: Internal Medicine

## 2020-05-30 ENCOUNTER — Other Ambulatory Visit: Payer: Self-pay

## 2020-05-30 DIAGNOSIS — Z8673 Personal history of transient ischemic attack (TIA), and cerebral infarction without residual deficits: Secondary | ICD-10-CM | POA: Diagnosis not present

## 2020-05-30 DIAGNOSIS — L89620 Pressure ulcer of left heel, unstageable: Secondary | ICD-10-CM | POA: Insufficient documentation

## 2020-05-30 NOTE — Progress Notes (Signed)
DERYL, PORTS ([id number]) Visit Report for 05/30/2020 Debridement Details Patient Name: Date of Service: Samantha, Duncan 05/30/2020 3:15 PM Medical Record Number: [id number] Patient Account Number: [id number] Date of Birth/Sex: Treating RN: 1956/03/17 (65 y.o. Samantha Duncan, Samantha Duncan Primary Care Provider: Burton [name] Other Clinician: Referring Provider: Treating Provider/Extender: Nichola Sizer in Treatment: 13 Debridement Performed for Assessment: Wound #1 Left Calcaneus Performed By: Physician Maxwell Caul., MD Debridement Type: Debridement Level of Consciousness (Pre-procedure): Awake and Alert Pre-procedure Verification/Time Out Yes - 16:25 Taken: Start Time: 16:26 Pain Control: Lidocaine 4% T opical Solution T Area Debrided (L x W): otal 0.7 (cm) x 1.6 (cm) = 1.12 (cm) Tissue and other material debrided: Viable, Non-Viable, Slough, Subcutaneous, Skin: Dermis , Skin: Epidermis, Fibrin/Exudate, Slough Level: Skin/Subcutaneous Tissue Debridement Description: Excisional Instrument: Curette Bleeding: Moderate Hemostasis Achieved: Silver Nitrate End Time: 16:32 Procedural Pain: 0 Post Procedural Pain: 0 Response to Treatment: Procedure was tolerated well Level of Consciousness (Post- Awake and Alert procedure): Post Debridement Measurements of Total Wound Length: (cm) 0.7 Stage: Unstageable/Unclassified Width: (cm) 1.6 Depth: (cm) 0.3 Volume: (cm) 0.264 Character of Wound/Ulcer Post Debridement: Improved Post Procedure Diagnosis Same as Pre-procedure Electronic Signature(s) Signed: 05/30/2020 5:18:36 PM By: Baltazar Najjar MD Signed: 05/30/2020 6:28:52 PM By: Shawn Stall Entered By: Baltazar Najjar on 05/30/2020 16:56:14 -------------------------------------------------------------------------------- HPI Details Patient Name: Date of Service: Samantha Duncan. 05/30/2020 3:15 PM Medical Record Number: [id number] Patient Account Number:  [id number] Date of Birth/Sex: Treating RN: [birth year] (65 y.o. 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 Her problem started in August when she suffered a fall in the bathroom. She was in the ER on 01/13/2020 with CT scan showing nondisplaced sacral and pubic rami fractures. I do not think she was admitted but she represented with severe pain on 8/27 and arrangements were made for her to go to [facility]. At some point somebody put an Ace wrap on her left leg which may have been in the hospital. However the next time her son saw her the same Ace wrap was still in place now with hemorrhagic blisters over the anterior ankle and a black eschared wound on the left heel. Based on a trip to the ER 01/30/2020 the heel looks like a deep tissue injury. The wrap had denuded skin on the left anterior ankle. An x-ray was negative. The patient is now back at home. She has a roommate and home health aides and her son. They do not have a lot of support by the sound of things. The patient originally walked with an AFO brace because of left foot drop but she has not been using that out of fear it would put pressure on the heel and I agree with that. However she is anxious to pursue physical therapy if that is possible. It was not really clear to our staff what they have been using on this wound although she does have home health. In further discussion I think it was Betadine back wet to dry dressings. On the anterior foot/ankle they have been using antibiotic ointments. ABI on the left was 0.92 03/07/2020; severe wrap injury as described above. There is an area on the dorsal aspect of her ankle at the  crease with  her foot as well as a more impressive area over the heel. The area on the ankle looks as though it is trying to close however the area on the heel still requires an extensive debridement of necrotic tissue fortunately there is been no evidence of infection. Using Iodoflex 10/21; the patient has 1 small open area on the dorsal aspect of her ankle. I think this is feeling in we have been using Iodosorb. The heel still has a very necrotic surface we have been using Iodoflex on this 03/27/2020 on evaluation today patient appears to be doing well in regard to her ulcers. The dorsal foot actually appears to be healed based on what I am seeing today. The heel is improving with the Iodoflex I feel like this is doing a great job. 04/17/2020 on evaluation today patient appears to be doing well with regard to her foot ulcer on the heel. Fortunately there is no signs of active infection she is healing quite nicely which is great news. No fevers, chills, nausea, vomiting, or diarrhea. 05/01/2020 on evaluation today patient appears to actually be doing quite well in regard to her heel ulcer. I do believe the Iodoflex is doing a great job for her. There does not appear to be any signs of active infection at this time. 05/30/2020; patient has not been here in a while. Been using Iodoflex on this wound comes in with about 50% of this hyper granulated. Electronic Signature(s) Signed: 05/30/2020 5:18:36 PM By: Baltazar Najjar MD Entered By: Baltazar Najjar on 05/30/2020 16:57:06 -------------------------------------------------------------------------------- Physical Exam Details Patient Name: Date of Service: Samantha Duncan 05/30/2020 3:15 PM Medical Record Number: [id number] Patient Account Number: [id number] Date of Birth/Sex: Treating RN: 06/19/1955 (65 y.o. Samantha Duncan Primary Care Provider: Burton [name] Other Clinician: Referring Provider: Treating Provider/Extender: Nichola Sizer in Treatment: 13 Constitutional Sitting or standing Blood Pressure is within target range for patient.. Pulse regular and within target range for patient.Marland Kitchen Respirations regular, non-labored and within target range.. Temperature is normal and within the target range for the patient.Marland Kitchen Appears in no distress. Notes Wound exam; the patient had good improvement in surface area and volume. Using a #5 curette I removed callus thick skin from the wound margins and the hyper granulated area. Hemostasis with silver nitrate. Electronic Signature(s) Signed: 05/30/2020 5:18:36 PM By: Baltazar Najjar MD Entered By: Baltazar Najjar on 05/30/2020 16:57:52 -------------------------------------------------------------------------------- Physician Orders Details Patient Name: Date of Service: Samantha Duncan. 05/30/2020 3:15 PM Medical Record Number: [id number] Patient Account Number: [id number] Date of Birth/Sex: Treating RN: Sep 30, 1955 (65 y.o. Samantha Duncan Primary Care Provider: Burton [name] Other Clinician: Referring Provider: Treating Provider/Extender: Nichola Sizer in Treatment: [phone] Verbal / Phone Orders: No Diagnosis Coding Follow-up Appointments Return Appointment in 2 weeks. Bathing/ Shower/ Hygiene May shower with protection but do not get wound dressing(s) wet. Off-Loading Turn and reposition every 2 hours Other: - avoid pressure to back of heels, float heels with pillows under calves while in bed Home Health New wound care orders this week; continue Home Health for wound care. May utilize formulary equivalent dressing for wound treatment orders unless otherwise specified. [facility] twice a week. Wound Treatment Wound #1 - Calcaneus Wound Laterality: Left Cleanser: Normal Saline (Home Health) 2 x Per Week/30 Days Discharge Instructions: Cleanse the wound with Normal Saline prior to applying a clean dressing using  gauze sponges, not tissue or cotton balls. Prim Dressing: Hydrofera Blue Classic Foam,  2x2 in [facility]) 2 x Per Week/30 Days ary Discharge Instructions: Moisten with saline prior to applying to wound bed Secondary Dressing: Woven Gauze Sponge, Non-Sterile 4x4 in (Home Health) 2 x Per Week/30 Days Discharge Instructions: Apply over primary dressing as directed. Secured With: [employer], 4.5x3.1 (in/yd) (Home Health) 2 x Per Week/30 Days Discharge Instructions: Secure with Kerlix , start wrapping at base of toes to help with foot edema Electronic Signature(s) Signed: 05/30/2020 5:18:36 PM By: Baltazar Najjar MD Signed: 05/30/2020 6:28:52 PM By: Shawn Stall Entered By: Shawn Stall on 05/30/2020 16:33:27 -------------------------------------------------------------------------------- Problem List Details Patient Name: Date of Service: Samantha Duncan. 05/30/2020 3:15 PM Medical Record Number: [id number] Patient Account Number: [id number] Date of Birth/Sex: Treating RN: 07-17-1955 (65 y.o. Samantha Duncan, Samantha Duncan Primary Care Provider: Burton [name] Other Clinician: Referring Provider: Treating Provider/Extender: Nichola Sizer in Treatment: 13 Active Problems ICD-10 Encounter Code Description Active Date MDM Diagnosis L89.620 Pressure ulcer of left heel, unstageable 02/29/2020 No Yes L97.528 Non-pressure chronic ulcer of other part of left foot with other specified 02/29/2020 No Yes severity R26.0 Ataxic gait 02/29/2020 No Yes Inactive Problems Resolved Problems Electronic Signature(s) Signed: 05/30/2020 5:18:36 PM By: Baltazar Najjar MD Entered By: Baltazar Najjar on 05/30/2020 16:52:35 -------------------------------------------------------------------------------- Progress Note Details Patient Name: Date of Service: Samantha Duncan. 05/30/2020 3:15 PM Medical Record Number: [id number] Patient Account Number: [id number] Date of Birth/Sex: Treating  RN: 1955-12-24 (65 y.o. 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 Her problem started in August when she suffered a fall in the bathroom. She was in the ER on 01/13/2020 with CT scan showing nondisplaced sacral and pubic rami fractures. I do not think she was admitted but she represented with severe pain on 8/27 and arrangements were made for her to go to [facility]. At some point somebody put an Ace wrap on her left leg which may have been in the hospital. However the next time her son saw her the same Ace wrap was still in place now with hemorrhagic blisters over the anterior ankle and a black eschared wound on the left heel. Based on a trip to the ER 01/30/2020 the heel looks like a deep tissue injury. The wrap had denuded skin on the left anterior ankle. An x-ray was negative. The patient is now back at home. She has a roommate and home health aides and her son. They do not have a lot of support by the sound of things. The patient originally walked with an AFO brace because of left foot drop but she has not been using that out of fear it would put pressure on the heel and I agree with that. However she is anxious to pursue physical therapy if that is possible. It was not really clear to our staff what they have been using on this wound although she does have home health. In further discussion I think it was Betadine back wet to dry dressings. On the anterior foot/ankle they have been using antibiotic ointments. ABI on the left was 0.92 03/07/2020; severe wrap injury as described above. There is an area on the dorsal aspect of her ankle at the crease with her foot as well as a more  impressive area over  the heel. The area on the ankle looks as though it is trying to close however the area on the heel still requires an extensive debridement of necrotic tissue fortunately there is been no evidence of infection. Using Iodoflex 10/21; the patient has 1 small open area on the dorsal aspect of her ankle. I think this is feeling in we have been using Iodosorb. The heel still has a very necrotic surface we have been using Iodoflex on this 03/27/2020 on evaluation today patient appears to be doing well in regard to her ulcers. The dorsal foot actually appears to be healed based on what I am seeing today. The heel is improving with the Iodoflex I feel like this is doing a great job. 04/17/2020 on evaluation today patient appears to be doing well with regard to her foot ulcer on the heel. Fortunately there is no signs of active infection she is healing quite nicely which is great news. No fevers, chills, nausea, vomiting, or diarrhea. 05/01/2020 on evaluation today patient appears to actually be doing quite well in regard to her heel ulcer. I do believe the Iodoflex is doing a great job for her. There does not appear to be any signs of active infection at this time. 05/30/2020; patient has not been here in a while. Been using Iodoflex on this wound comes in with about 50% of this hyper granulated. Objective Constitutional Sitting or standing Blood Pressure is within target range for patient.. Pulse regular and within target range for patient.Marland Kitchen Respirations regular, non-labored and within target range.. Temperature is normal and within the target range for the patient.Marland Kitchen Appears in no distress. Vitals Time Taken: 3:58 PM, Height: 61 in, Weight: 105 lbs, BMI: 19.8, Temperature: 97.9 F, Pulse: 60 bpm, Respiratory Rate: 20 breaths/min, Blood Pressure: 128/72 mmHg. General Notes: Wound exam; the patient had good improvement in surface area and volume. Using a #5 curette I removed callus thick  skin from the wound margins and the hyper granulated area. Hemostasis with silver nitrate. Integumentary (Hair, Skin) Wound #1 status is Open. Original cause of wound was Blister. The wound is located on the Left Calcaneus. The wound measures 0.7cm length x 1.6cm width x 0.3cm depth; 0.88cm^2 area and 0.264cm^3 volume. There is Fat Layer (Subcutaneous Tissue) exposed. There is no tunneling or undermining noted. There is a medium amount of serosanguineous drainage noted. The wound margin is distinct with the outline attached to the wound base. There is medium (34-66%) pink, hyper - granulation within the wound bed. There is a medium (34-66%) amount of necrotic tissue within the wound bed including Adherent Slough. 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 There was a Excisional Skin/Subcutaneous Tissue Debridement with a total area of 1.12 sq cm performed by Ricard Dillon., MD. With the following instrument(s): Curette to remove Viable and Non-Viable tissue/material. Material removed includes Subcutaneous Tissue, Slough, Skin: Dermis, Skin: Epidermis, and Fibrin/Exudate after achieving pain control using Lidocaine 4% Topical Solution. A time out was conducted at 16:25, prior to the start of the procedure. A Moderate amount of bleeding was controlled with Silver Nitrate. The procedure was tolerated well with a pain level of 0 throughout and a pain level of 0 following the procedure. Post Debridement Measurements: 0.7cm length x 1.6cm width x 0.3cm depth; 0.264cm^3 volume.  Post debridement Stage noted as Unstageable/Unclassified. Character of Wound/Ulcer Post Debridement is improved. Post procedure Diagnosis Wound #1: Same as Pre-Procedure Plan Follow-up  Appointments: Return Appointment in 2 weeks. Bathing/ Shower/ Hygiene: May shower with protection but do not get wound dressing(s) wet. Off-Loading: Turn and reposition every 2 hours Other: - avoid pressure to back of heels, float heels with pillows under calves while in bed Home Health: New wound care orders this week; continue Home Health for wound care. May utilize formulary equivalent dressing for wound treatment orders unless otherwise specified. [facility] twice a week. WOUND #1: - Calcaneus Wound Laterality: Left Cleanser: Normal Saline (Home Health) 2 x Per Week/30 Days Discharge Instructions: Cleanse the wound with Normal Saline prior to applying a clean dressing using gauze sponges, not tissue or cotton balls. Prim Dressing: Hydrofera Blue Classic Foam, 2x2 in (Home Health) 2 x Per Week/30 Days ary Discharge Instructions: Moisten with saline prior to applying to wound bed Secondary Dressing: Woven Gauze Sponge, Non-Sterile 4x4 in (Home Health) 2 x Per Week/30 Days Discharge Instructions: Apply over primary dressing as directed. Secured With: [employer], 4.5x3.1 (in/yd) (Home Health) 2 x Per Week/30 Days Discharge Instructions: Secure with Kerlix , start wrapping at base of toes to help with foot edema 1. Change the primary dressing here to [facility]. 2. She has home health 3. Follow-up in 2 weeks Electronic Signature(s) Signed: 05/30/2020 5:18:36 PM By: Baltazar Najjar MD Entered By: Baltazar Najjar on 05/30/2020 16:58:33 -------------------------------------------------------------------------------- SuperBill Details Patient Name: Date of Service: Samantha Duncan 05/30/2020 Medical Record Number: [id number] Patient Account Number: [id number] Date of Birth/Sex: Treating RN: 04-28-1956 (65 y.o. Samantha Duncan Primary Care Provider: Burton [name] Other Clinician: Referring Provider: Treating Provider/Extender: Nichola Sizer in Treatment: 13 Diagnosis Coding ICD-10 Codes Code Description [phone] Pressure ulcer of left heel, unstageable L97.528 Non-pressure chronic ulcer of other part of left foot with other specified severity R26.0 Ataxic gait Facility Procedures CPT4 Code: [id number] Description: 11042 - DEB SUBQ TISSUE 20 SQ CM/< ICD-10 Diagnosis Description L89.620 Pressure ulcer of left heel, unstageable Modifier: Quantity: 1 Physician Procedures : CPT4 Code Description Modifier [id number] 11042 - WC PHYS SUBQ TISS 20 SQ CM ICD-10 Diagnosis Description L89.620 Pressure ulcer of left heel, unstageable Quantity: 1 Electronic Signature(s) Signed: 05/30/2020 5:18:36 PM By: Baltazar Najjar MD Entered By: Baltazar Najjar on 05/30/2020 16:59:03

## 2020-06-03 NOTE — Progress Notes (Signed)
Samantha, Duncan ([id number]) Visit Report for 05/30/2020 Arrival Information Details Patient Name: Date of Service: Samantha, Duncan 05/30/2020 3:15 PM Medical Record Number: [id number] Patient Account Number: [id number] Date of Birth/Sex: Treating RN: 1956/01/12 (65 y.o. Samantha Duncan, Samantha Duncan Primary Care Samantha Duncan: Samantha Duncan Other Clinician: Referring Samantha Duncan: Treating Samantha Duncan/Extender: Samantha Duncan in Treatment: 13 Visit Information History Since Last Visit Added or deleted any medications: No Patient Arrived: Wheel Chair Any new allergies or adverse reactions: No Arrival Time: 15:58 Had a fall or experienced change in No Accompanied By: son activities of daily living that may affect Transfer Assistance: None risk of falls: Patient Identification Verified: Yes Signs or symptoms of abuse/neglect since last visito No Secondary Verification Process Completed: Yes Hospitalized since last visit: No Patient Requires Transmission-Based Precautions: No Implantable device outside of the clinic excluding No Patient Has Alerts: No cellular tissue based products placed in the center since last visit: Has Dressing in Place as Prescribed: Yes Pain Present Now: No Electronic Signature(s) Signed: 05/31/2020 11:05:27 AM By: Samantha Duncan Entered By: Samantha Duncan on 05/30/2020 15:58:44 -------------------------------------------------------------------------------- Encounter Discharge Information Details Patient Name: Date of Service: Samantha Duncan. 05/30/2020 3:15 PM Medical Record Number: [id number] Patient Account Number: [id number] Date of Birth/Sex: Treating RN: 1956/03/01 (65 y.o. Wynelle Link Primary Care Samantha Duncan: Samantha Duncan Other Clinician: Referring Nathaneal Sommers: Treating Samantha Duncan/Extender: Samantha Duncan in Treatment: 13 Encounter Discharge Information Items Post Procedure Vitals Discharge Condition:  Stable Temperature (F): 97.9 Ambulatory Status: Wheelchair Pulse (bpm): 60 Discharge Destination: Home Respiratory Rate (breaths/min): 20 Transportation: Private Auto Blood Pressure (mmHg): 128/72 Accompanied By: son Schedule Follow-up Appointment: Yes Clinical Summary of Care: Patient Declined Electronic Signature(s) Signed: 06/03/2020 5:04:19 PM By: Samantha Abts RN, BSN Entered By: Samantha Duncan on 05/30/2020 17:19:30 -------------------------------------------------------------------------------- Lower Extremity Assessment Details Patient Name: Date of Service: Samantha Duncan. 05/30/2020 3:15 PM Medical Record Number: [id number] Patient Account Number: [id number] Date of Birth/Sex: Treating RN: 09-Feb-1956 (65 y.o. Wynelle Link Primary Care Tyrea Froberg: Samantha Duncan Other Clinician: Referring Samantha Duncan: Treating Samantha Duncan/Extender: Samantha Duncan in Treatment: 13 Edema Assessment Assessed: Samantha Duncan: No] Samantha Duncan: No] Edema: [Left: Ye] [Right: s] Calf Left: Right: Point of Measurement: From Medial Instep 26 cm Ankle Left: Right: Point of Measurement: From Medial Instep 19 cm Vascular Assessment Pulses: Dorsalis Pedis Palpable: [Left:Yes] Electronic Signature(s) Signed: 06/03/2020 5:04:19 PM By: Samantha Abts RN, BSN Entered By: Samantha Duncan on 05/30/2020 16:18:58 -------------------------------------------------------------------------------- Multi Wound Chart Details Patient Name: Date of Service: Samantha Duncan. 05/30/2020 3:15 PM Medical Record Number: [id number] Patient Account Number: [id number] Date of Birth/Sex: Treating RN: 07-Jul-1955 (65 y.o. Samantha Duncan Primary Care Moriyah Byington: Samantha Duncan Other Clinician: Referring Samantha Duncan: Treating Samantha Duncan/Extender: Samantha Duncan in Treatment: 13 Vital Signs Height(in): 61 Pulse(bpm): 60 Weight(lbs): 105 Blood Pressure(mmHg): 128/72 Body Mass  Index(BMI): 20 Temperature(F): 97.9 Respiratory Rate(breaths/min): 20 Photos: [1:No Photos Left Calcaneus] [N/A:N/A N/A] Wound Location: [1:Blister] [N/A:N/A] Wounding Event: [1:Pressure Ulcer] [N/A:N/A] Primary Etiology: [1:Hypertension] [N/A:N/A] Comorbid History: [1:01/10/2020] [N/A:N/A] Date Acquired: [1:13] [N/A:N/A] Weeks of Treatment: [1:Open] [N/A:N/A] Wound Status: [1:0.7x1.6x0.3] [N/A:N/A] Measurements L x W x D (cm) [1:0.88] [N/A:N/A] A (cm) : rea [1:0.264] [N/A:N/A] Volume (cm) : [1:89.40%] [N/A:N/A] % Reduction in A rea: [1:68.10%] [N/A:N/A] % Reduction in Volume: [1:Unstageable/Unclassified] [N/A:N/A] Classification: [1:Medium] [N/A:N/A] Exudate A mount: [1:Serosanguineous] [N/A:N/A] Exudate Type: [1:red, brown] [N/A:N/A] Exudate Color: [1:Distinct, outline attached] [N/A:N/A] Wound Margin: [1:Medium (34-66%)] [N/A:N/A] Granulation A mount: [1:Pink, Hyper-granulation] [N/A:N/A] Granulation  Quality: [1:Medium (34-66%)] [N/A:N/A] Necrotic A mount: [1:Fat Layer (Subcutaneous Tissue): Yes N/A] Exposed Structures: [1:Fascia: No Tendon: No Muscle: No Joint: No Bone: No Small (1-33%)] [N/A:N/A] Epithelialization: [1:Debridement - Excisional] [N/A:N/A] Debridement: Pre-procedure Verification/Time Out 16:25 [N/A:N/A] Taken: [1:Lidocaine 4% T opical Solution] [N/A:N/A] Pain Control: [1:Subcutaneous, Slough] [N/A:N/A] Tissue Debrided: [1:Skin/Subcutaneous Tissue] [N/A:N/A] Level: [1:1.12] [N/A:N/A] Debridement A (sq cm): [1:rea Curette] [N/A:N/A] Instrument: [1:Moderate] [N/A:N/A] Bleeding: [1:Silver Nitrate] [N/A:N/A] Hemostasis A chieved: [1:0] [N/A:N/A] Procedural Pain: [1:0] [N/A:N/A] Post Procedural Pain: [1:Procedure was tolerated well] [N/A:N/A] Debridement Treatment Response: [1:0.7x1.6x0.3] [N/A:N/A] Post Debridement Measurements L x W x D (cm) [1:0.264] [N/A:N/A] Post Debridement Volume: (cm) [1:Unstageable/Unclassified] [N/A:N/A] Post Debridement Stage:  [1:Debridement] [N/A:N/A] Treatment Notes Electronic Signature(s) Signed: 05/30/2020 5:18:36 PM By: Linton Ham MD Signed: 05/30/2020 6:28:52 PM By: Deon Pilling Entered By: Linton Ham on 05/30/2020 16:53:09 -------------------------------------------------------------------------------- Multi-Disciplinary Care Plan Details Patient Name: Date of Service: Samantha Duncan. 05/30/2020 3:15 PM Medical Record Number: [id number] Patient Account Number: [id number] Date of Birth/Sex: Treating RN: 07/29/1955 (65 y.o. Samantha Duncan, Tammi Klippel Primary Care Qais Jowers: Lorene Dy Other Clinician: Referring Javaria Knapke: Treating Dezaree Tracey/Extender: Sabino Gasser in Treatment: 13 Active Inactive Pressure Nursing Diagnoses: Potential for impaired tissue integrity related to pressure, friction, moisture, and shear Goals: Patient will remain free from development of additional pressure ulcers Date Initiated: 02/29/2020 Date Inactivated: 03/27/2020 Target Resolution Date: 03/29/2020 Goal Status: Met Patient/caregiver will verbalize understanding of pressure ulcer management Date Initiated: 02/29/2020 Target Resolution Date: 06/21/2020 Goal Status: Active Interventions: Assess: immobility, friction, shearing, incontinence upon admission and as needed Assess potential for pressure ulcer upon admission and as needed Provide education on pressure ulcers Treatment Activities: Patient referred for pressure reduction/relief devices : 02/29/2020 T ordered outside of clinic : 02/29/2020 est Notes: Wound/Skin Impairment Nursing Diagnoses: Knowledge deficit related to ulceration/compromised skin integrity Goals: Patient/caregiver will verbalize understanding of skin care regimen Date Initiated: 02/29/2020 Target Resolution Date: 06/21/2020 Goal Status: Active Interventions: Assess patient/caregiver ability to obtain necessary supplies Assess patient/caregiver ability to perform  ulcer/skin care regimen upon admission and as needed Provide education on ulcer and skin care Treatment Activities: Skin care regimen initiated : 02/29/2020 Topical wound management initiated : 02/29/2020 Notes: Electronic Signature(s) Signed: 05/30/2020 6:28:52 PM By: Deon Pilling Entered By: Deon Pilling on 05/30/2020 16:33:55 -------------------------------------------------------------------------------- Pain Assessment Details Patient Name: Date of Service: Samantha Duncan, Samantha Duncan 05/30/2020 3:15 PM Medical Record Number: [id number] Patient Account Number: [id number] Date of Birth/Sex: Treating RN: [birth year] (65 y.o. Samantha Duncan, Tammi Klippel Primary Care Keylin Podolsky: Lorene Dy Other Clinician: Referring Kirk Basquez: Treating Andreika Vandagriff/Extender: Sabino Gasser in Treatment: 13 Active Problems Location of Pain Severity and Description of Pain Patient Has Paino No Site Locations Pain Management and Medication Current Pain Management: Electronic Signature(s) Signed: 05/30/2020 6:28:52 PM By: Deon Pilling Signed: 05/31/2020 11:05:27 AM By: Sandre Kitty Entered By: Sandre Kitty on 05/30/2020 15:59:06 -------------------------------------------------------------------------------- Patient/Caregiver Education Details Patient Name: Date of Service: Samantha Duncan 1/6/2022andnbsp3:15 PM Medical Record Number: [id number] Patient Account Number: [id number] Date of Birth/Gender: Treating RN: 1955-08-15 (65 y.o. [name] Primary Care Physician: Lorene Dy Other Clinician: Referring Physician: Treating Physician/Extender: Sabino Gasser in Treatment: 13 Education Assessment Education Provided To: Patient Education Topics Provided Pressure: Handouts: Preventing Pressure Ulcers Methods: Explain/Verbal Responses: Reinforcements needed Electronic Signature(s) Signed: 05/30/2020 6:28:52 PM By: Deon Pilling Entered By: Deon Pilling on 05/30/2020 16:34:25 -------------------------------------------------------------------------------- Wound Assessment Details Patient Name: Date of Service: Samantha Duncan. 05/30/2020 3:15 PM Medical Record  Number: [id number] Patient Account Number: [id number] Date of Birth/Sex: Treating RN: [birth year] (65 y.o. Samantha Duncan, Samantha Duncan Primary Care Ji Feldner: Lorene Dy Other Clinician: Referring Jarrett Albor: Treating Caliya Narine/Extender: Sabino Gasser in Treatment: 13 Wound Status Wound Number: 1 Primary Etiology: Pressure Ulcer Wound Location: Left Calcaneus Wound Status: Open Wounding Event: Blister Comorbid History: Hypertension Date Acquired: 01/10/2020 Weeks Of Treatment: 13 Clustered Wound: No Wound Measurements Length: (cm) 0.7 Width: (cm) 1.6 Depth: (cm) 0.3 Area: (cm) 0.88 Volume: (cm) 0.264 % Reduction in Area: 89.4% % Reduction in Volume: 68.1% Epithelialization: Small (1-33%) Tunneling: No Undermining: No Wound Description Classification: Unstageable/Unclassified Wound Margin: Distinct, outline attached Exudate Amount: Medium Exudate Type: Serosanguineous Exudate Color: red, brown Foul Odor After Cleansing: No Slough/Fibrino Yes Wound Bed Granulation Amount: Medium (34-66%) Exposed Structure Granulation Quality: Pink, Hyper-granulation Fascia Exposed: No Necrotic Amount: Medium (34-66%) Fat Layer (Subcutaneous Tissue) Exposed: Yes Necrotic Quality: Adherent Slough Tendon Exposed: No Muscle Exposed: No Joint Exposed: No Bone Exposed: No Treatment Notes Wound #1 (Calcaneus) Wound Laterality: Left Cleanser Normal Saline Discharge Instruction: Cleanse the wound with Normal Saline prior to applying a clean dressing using gauze sponges, not tissue or cotton balls. Peri-Wound Care Topical Primary Dressing Hydrofera Blue Classic Foam, 2x2 in Discharge Instruction: Moisten with saline prior to applying to wound  bed Secondary Dressing Woven Gauze Sponge, Non-Sterile 4x4 in Discharge Instruction: Apply over primary dressing as directed. Secured With [employer], 4.5x3.1 (in/yd) Discharge Instruction: Secure with Kerlix , start wrapping at base of toes to help with foot edema Compression Wrap Compression Stockings Add-Ons Electronic Signature(s) Signed: 05/30/2020 6:28:52 PM By: Deon Pilling Signed: 06/03/2020 5:04:19 PM By: Levan Hurst RN, BSN Entered By: Levan Hurst on 05/30/2020 16:18:36 -------------------------------------------------------------------------------- [place] Details Patient Name: Date of Service: Samantha Slates R. 05/30/2020 3:15 PM Medical Record Number: [id number] Patient Account Number: [id number] Date of Birth/Sex: Treating RN: September 05, 1955 (65 y.o. Samantha Duncan, Samantha Duncan Primary Care Metha Kolasa: Lorene Dy Other Clinician: Referring Shann Lewellyn: Treating Telisha Zawadzki/Extender: Sabino Gasser in Treatment: 13 Vital Signs Time Taken: 15:58 Temperature (F): 97.9 Height (in): 61 Pulse (bpm): 60 Weight (lbs): 105 Respiratory Rate (breaths/min): 20 Body Mass Index (BMI): 19.8 Blood Pressure (mmHg): 128/72 Reference Range: 80 - 120 mg / dl Electronic Signature(s) Signed: 05/31/2020 11:05:27 AM By: Sandre Kitty Entered By: Sandre Kitty on 05/30/2020 15:59:01

## 2020-06-13 ENCOUNTER — Encounter (HOSPITAL_BASED_OUTPATIENT_CLINIC_OR_DEPARTMENT_OTHER): Payer: Medicare PPO | Admitting: Internal Medicine

## 2020-06-13 ENCOUNTER — Other Ambulatory Visit: Payer: Self-pay

## 2020-06-13 DIAGNOSIS — L89620 Pressure ulcer of left heel, unstageable: Secondary | ICD-10-CM | POA: Diagnosis not present

## 2020-06-13 NOTE — Progress Notes (Signed)
Samantha Duncan, Samantha Duncan ([id number]) Visit Report for 06/13/2020 HPI Details Patient Name: Date of Service: Samantha, Duncan 06/13/2020 3:15 PM Medical Record Number: [id number] Patient Account Number: [id number] Date of Birth/Sex: Treating RN: 10-11-1955 (65 y.o. 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 Her problem started in August when she suffered a fall in the bathroom. She was in the ER on 01/13/2020 with CT scan showing nondisplaced sacral and pubic rami fractures. I do not think she was admitted but she represented with severe pain on 8/27 and arrangements were made for her to go to [facility]. At some point somebody put an Ace wrap on her left leg which may have been in the hospital. However the next time her son saw her the same Ace wrap was still in place now with hemorrhagic blisters over the anterior ankle and a black eschared wound on the left heel. Based on a trip to the ER 01/30/2020 the heel looks like a deep tissue injury. The wrap had denuded skin on the left anterior ankle. An x-ray was negative. The patient is now back at home. She has a roommate and home health aides and her son. They do not have a lot of support by the sound of things. The patient originally walked with an AFO brace because of left foot drop but she has not been using that out of fear it would put pressure on the heel and I agree with that. However she is anxious to pursue physical therapy if that is possible. It was not really clear to our staff what they have been using on this wound although she does have home health. In further discussion I think it was Betadine back wet to dry dressings. On the  anterior foot/ankle they have been using antibiotic ointments. ABI on the left was 0.92 03/07/2020; severe wrap injury as described above. There is an area on the dorsal aspect of her ankle at the crease with her foot as well as a more impressive area over the heel. The area on the ankle looks as though it is trying to close however the area on the heel still requires an extensive debridement of necrotic tissue fortunately there is been no evidence of infection. Using Iodoflex 10/21; the patient has 1 small open area on the dorsal aspect of her ankle. I think this is feeling in we have been using Iodosorb. The heel still has a very necrotic surface we have been using Iodoflex on this 03/27/2020 on evaluation today patient appears to be doing well in regard to her ulcers. The dorsal foot actually appears to be healed based on what I am seeing today. The heel is improving with the Iodoflex I feel like this is doing a great job. 04/17/2020 on evaluation today patient appears to be doing well with regard to her foot ulcer on the heel. Fortunately there is no signs of active infection she is healing quite nicely which is great news. No fevers, chills, nausea, vomiting, or diarrhea. 05/01/2020 on evaluation today patient appears to actually be doing quite well in regard to her heel ulcer. I do believe the Iodoflex is doing a great job for her. There does not appear to be any signs of active infection at this  time. 05/30/2020; patient has not been here in a while. Been using Iodoflex on this wound comes in with about 50% of this hyper granulated. 1/20; patient with a wound on her left heel this may have been caused by a Ace wrap that was left alone for an inordinately long period of time. This is just about closed currently. We use silver nitrate on this last time because of hypergranulation [facility] the [facility] and this really looks just about closed Electronic Signature(s) Signed:  06/13/2020 4:36:20 PM By: Baltazar Najjarobson, Michael MD Entered By: Baltazar Najjarobson, Michael on 06/13/2020 16:26:19 -------------------------------------------------------------------------------- Physical Exam Details Patient Name: Date of Service: Samantha Duncan, [name] 06/13/2020 3:15 PM Medical Record Number: [id number] Patient Account Number: [id number] Date of Birth/Sex: Treating RN: 10/05/1955 (65 y.o. Samantha Duncan) Deaton, [name] Primary Care Provider: Burton Apleyoberts, Ronald Other Clinician: Referring Provider: Treating Provider/Extender: Nichola Sizerobson, Michael Roberts, Ronald Weeks in Treatment: 15 Constitutional Patient is hypertensive.. Pulse regular and within target range for patient.Marland Kitchen. Respirations regular, non-labored and within target range.. Temperature is normal and within the target range for the patient.Marland Kitchen. Appears in no distress. Notes Wound exam; the patient has good improvement in the surface area and volume this is now very superficial and should be closed by the next time we see her. There is no surrounding infection Electronic Signature(s) Signed: 06/13/2020 4:36:20 PM By: Baltazar Najjarobson, Michael MD Entered By: Baltazar Najjarobson, Michael on 06/13/2020 16:28:50 -------------------------------------------------------------------------------- Physician Orders Details Patient Name: Date of Service: Samantha Duncan, [name] 06/13/2020 3:15 PM Medical Record Number: [id number] Patient Account Number: [id number] Date of Birth/Sex: Treating RN: 10/05/1955 (65 y.o. 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 Bathing/ Shower/  Hygiene May shower with protection but do not get wound dressing(s) wet. Off-Loading Turn and reposition every 2 hours Other: - avoid pressure to back of heels, float heels with pillows under calves while in bed Home Health New wound care orders this week; continue Home Health for wound care. May utilize formulary equivalent dressing for wound treatment orders unless otherwise specified. [facility] twice a week. Wound Treatment Wound #1 - Calcaneus Wound Laterality: Left Cleanser: Normal Saline (Home Health) 2 x Per Week/30 Days Discharge Instructions: Cleanse the wound with Normal Saline prior to applying a clean dressing using gauze sponges, not tissue or cotton balls. Prim Dressing: Hydrofera Blue Classic Foam, 2x2 in (Home Health) 2 x Per Week/30 Days ary Discharge Instructions: Moisten with saline prior to applying to wound bed Secondary Dressing: Woven Gauze Sponge, Non-Sterile 4x4 in (Home Health) 2 x Per Week/30 Days Discharge Instructions: Apply over primary dressing as directed. Secured With: [employer] Roll Sterile, 4.5x3.1 (in/yd) (Home Health) 2 x Per Week/30 Days Discharge Instructions: Secure with Kerlix , start wrapping at base of toes to help with foot edema Electronic Signature(s) Signed: 06/13/2020 4:36:20 PM By: Baltazar Najjarobson, Michael MD Signed: 06/13/2020 4:56:08 PM By: Shawn Stalleaton, [name] Entered By: Shawn Stalleaton, [name] on 06/13/2020 15:52:48 -------------------------------------------------------------------------------- Problem List Details Patient Name: Date of Service: Samantha Duncan, [name] 06/13/2020 3:15 PM Medical Record Number: [id number] Patient Account Number: [id number] Date of Birth/Sex: Treating RN: 10/05/1955 (65 y.o. Samantha Duncan) Deaton, [name] Primary Care Provider: Burton Apleyoberts, Ronald Other Clinician: Referring Provider:  Treating Provider/Extender: Nichola Sizer in Treatment: 15 Active Problems ICD-10 Encounter Code Description Active Date  MDM Diagnosis L89.620 Pressure ulcer of left heel, unstageable 02/29/2020 No Yes L97.528 Non-pressure chronic ulcer of other part of left foot with other specified 02/29/2020 No Yes severity R26.0 Ataxic gait 02/29/2020 No Yes Inactive Problems Resolved Problems Electronic Signature(s) Signed: 06/13/2020 4:36:20 PM By: Baltazar Najjar MD Entered By: Baltazar Najjar on 06/13/2020 16:24:52 -------------------------------------------------------------------------------- Progress Note Details Patient Name: Date of Service: Samantha Brothers. 06/13/2020 3:15 PM Medical Record Number: [id number] Patient Account Number: [id number] Date of Birth/Sex: Treating RN: 04-02-1956 (65 y.o. 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 Her problem started in August when she suffered a fall in the bathroom. She was in the ER on 01/13/2020 with CT scan showing nondisplaced sacral and pubic rami fractures. I do not think she was admitted but she represented with severe pain on 8/27 and arrangements were made for her to go to [facility]. At some point somebody put an Ace wrap on her left leg which may have been in the hospital. However the next time her son saw her the same Ace wrap was still in place now with hemorrhagic blisters over the anterior ankle and a black eschared wound on the left heel. Based on a trip to the ER 01/30/2020 the heel looks like a deep tissue injury. The wrap had denuded skin on the left anterior ankle. An x-ray was negative. The patient is now back at home. She has a roommate and home health aides and her son. They do not have a lot of support by the sound of things.  The patient originally walked with an AFO brace because of left foot drop but she has not been using that out of fear it would put pressure on the heel and I agree with that. However she is anxious to pursue physical therapy if that is possible. It was not really clear to our staff what they have been using on this wound although she does have home health. In further discussion I think it was Betadine back wet to dry dressings. On the anterior foot/ankle they have been using antibiotic ointments. ABI on the left was 0.92 03/07/2020; severe wrap injury as described above. There is an area on the dorsal aspect of her ankle at the crease with her foot as well as a more impressive area over the heel. The area on the ankle looks as though it is trying to close however the area on the heel still requires an extensive debridement of necrotic tissue fortunately there is been no evidence of infection. Using Iodoflex 10/21; the patient has 1 small open area on the dorsal aspect of her ankle. I think this is feeling in we have been using Iodosorb. The heel still has a very necrotic surface we have been using Iodoflex on this 03/27/2020 on evaluation today patient appears to be doing well in regard to her ulcers. The dorsal foot actually appears to be healed based on what I am seeing today. The heel is improving with the Iodoflex I feel like this is doing a great job. 04/17/2020 on evaluation today patient appears to be doing well with regard to her foot ulcer on  the heel. Fortunately there is no signs of active infection she is healing quite nicely which is great news. No fevers, chills, nausea, vomiting, or diarrhea. 05/01/2020 on evaluation today patient appears to actually be doing quite well in regard to her heel ulcer. I do believe the Iodoflex is doing a great job for her. There does not appear to be any signs of active infection at this time. 05/30/2020; patient has not been here in a while. Been using  Iodoflex on this wound comes in with about 50% of this hyper granulated. 1/20; patient with a wound on her left heel this may have been caused by a Ace wrap that was left alone for an inordinately long period of time. This is just about closed currently. We use silver nitrate on this last time because of hypergranulation change the dressing to [facility] and this really looks just about closed Objective Constitutional Patient is hypertensive.. Pulse regular and within target range for patient.Marland Kitchen Respirations regular, non-labored and within target range.. Temperature is normal and within the target range for the patient.Marland Kitchen Appears in no distress. Vitals Time Taken: 3:33 PM, Height: 61 in, Weight: 105 lbs, BMI: 19.8, Temperature: 98.0 F, Pulse: 70 bpm, Respiratory Rate: 16 breaths/min, Blood Pressure: 157/77 mmHg. General Notes: Wound exam; the patient has good improvement in the surface area and volume this is now very superficial and should be closed by the next time we see her. There is no surrounding infection Integumentary (Hair, Skin) Wound #1 status is Open. Original cause of wound was Blister. The wound is located on the Left Calcaneus. The wound measures 0.3cm length x 1cm width x 0.1cm depth; 0.236cm^2 area and 0.024cm^3 volume. There is Fat Layer (Subcutaneous Tissue) exposed. There is no tunneling or undermining noted. There is a small amount of serosanguineous drainage noted. The wound margin is distinct with the outline attached to the wound base. There is large (67-100%) pink granulation within the wound bed. There is a small (1-33%) amount of necrotic tissue within the wound bed including Adherent Slough. 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 Bathing/ Shower/ Hygiene: May shower  with protection but do not get wound dressing(s) wet. Off-Loading: Turn and reposition every 2 hours Other: - avoid pressure to back of heels, float heels with pillows under calves while in bed Home Health: New wound care orders this week; continue Home Health for wound care. May utilize formulary equivalent dressing for wound treatment orders unless otherwise specified. [facility] twice a week. WOUND #1: - Calcaneus Wound Laterality: Left Cleanser: Normal Saline (Home Health) 2 x Per Week/30 Days Discharge Instructions: Cleanse the wound with Normal Saline prior to applying a clean dressing using gauze sponges, not tissue or cotton balls. Prim Dressing: Hydrofera Blue Classic Foam, 2x2 in (Home Health) 2 x Per Week/30 Days ary Discharge Instructions: Moisten with saline prior to applying to wound bed Secondary Dressing: Woven Gauze Sponge, Non-Sterile 4x4 in (Home Health) 2 x Per Week/30 Days Discharge Instructions: Apply over primary dressing as directed. Secured With: [employer], 4.5x3.1 (in/yd) (Home Health) 2 x Per Week/30 Days Discharge Instructions: Secure with Kerlix , start wrapping at base of toes to help with foot edema 1. We continued with the Hydrofera Blue 2. This is being covered with foam 3. I think they have home health changing dressings  4. I cautioned them that after this heals they are going to have to be careful about excessive pressure in this area. The patient points out she has been wearing a heel offloading boot and would like to change into her normal footwear there is also the issue of an AFO brace they are planning to get the help with her mobility and again I asked them to be careful about how the AF oh brace plays over the healed tissue in this area Electronic Signature(s) Signed: 06/13/2020 4:36:20 PM By: Baltazar Najjarobson, Michael MD Entered By: Baltazar Najjarobson, Michael on 06/13/2020  16:30:14 -------------------------------------------------------------------------------- SuperBill Details Patient Name: Date of Service: Samantha Duncan, [name] 06/13/2020 Medical Record Number: [id number] Patient Account Number: [id number] Date of Birth/Sex: Treating RN: 08/12/1955 (65 y.o. 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

## 2020-07-02 NOTE — Progress Notes (Signed)
Samantha Duncan, Samantha Duncan ([id number]) Visit Report for 06/13/2020 Arrival Information Details Patient Name: Date of Service: Samantha Duncan, Samantha Duncan 06/13/2020 3:15 PM Medical Record Number: [id number] Patient Account Number: [id number] Date of Birth/Sex: Treating RN: 04-08-1956 (65 y.o. Samantha Duncan Primary Care Samantha Duncan: Samantha Duncan Other Clinician: Referring Samantha Duncan: Treating Samantha Duncan/Extender: Samantha Duncan in Treatment: 15 Visit Information History Since Last Visit Added or deleted any medications: No Patient Arrived: Wheel Chair Any new allergies or adverse reactions: No Arrival Time: 15:33 Had a fall or experienced change in No Accompanied By: son activities of daily living that may affect Transfer Assistance: None risk of falls: Patient Identification Verified: Yes Signs or symptoms of abuse/neglect since last visito No Secondary Verification Process Completed: Yes Hospitalized since last visit: No Patient Requires Transmission-Based Precautions: No Implantable device outside of the clinic excluding No Patient Has Alerts: No cellular tissue based products placed in the center since last visit: Has Dressing in Place as Prescribed: Yes Pain Present Now: No Electronic Signature(s) Signed: 07/02/2020 4:02:54 PM By: Samantha Hurst RN, BSN Entered By: Samantha Duncan on 06/13/2020 15:33:40 -------------------------------------------------------------------------------- Clinic Level of Care Assessment Details Patient Name: Date of Service: Samantha Duncan 06/13/2020 3:15 PM Medical Record Number: [id number] Patient Account Number: [id number] Date of Birth/Sex: Treating RN: [birth year] (65 y.o. Samantha Duncan, Samantha Duncan Primary Care Laelynn Blizzard: Samantha Duncan Other Clinician: Referring Samantha Duncan: Treating Samantha Duncan/Extender: Samantha Duncan in Treatment: 15 Clinic Level of Care Assessment Items TOOL 4 Quantity Score X- 1 0 Use when only an  EandM is performed on FOLLOW-UP visit ASSESSMENTS - Nursing Assessment / Reassessment X- 1 10 Reassessment of Co-morbidities (includes updates in patient status) X- 1 5 Reassessment of Adherence to Treatment Plan ASSESSMENTS - Wound and Skin A ssessment / Reassessment X - Simple Wound Assessment / Reassessment - one wound 1 5 '[]'  - 0 Complex Wound Assessment / Reassessment - multiple wounds X- 1 10 Dermatologic / Skin Assessment (not related to wound area) ASSESSMENTS - Focused Assessment X- 1 5 Circumferential Edema Measurements - multi extremities X- 1 10 Nutritional Assessment / Counseling / Intervention '[]'  - 0 Lower Extremity Assessment (monofilament, tuning fork, pulses) '[]'  - 0 Peripheral Arterial Disease Assessment (using hand held doppler) ASSESSMENTS - Ostomy and/or Continence Assessment and Care '[]'  - 0 Incontinence Assessment and Management '[]'  - 0 Ostomy Care Assessment and Management (repouching, etc.) PROCESS - Coordination of Care X - Simple Patient / Family Education for ongoing care 1 15 '[]'  - 0 Complex (extensive) Patient / Family Education for ongoing care X- 1 10 Staff obtains Consents, Records, T Results / Process Orders est X- 1 10 Staff telephones HHA, Nursing Homes / Clarify orders / etc '[]'  - 0 Routine Transfer to another Facility (non-emergent condition) '[]'  - 0 Routine Hospital Admission (non-emergent condition) '[]'  - 0 New Admissions / Biomedical engineer / Ordering NPWT Apligraf, etc. , '[]'  - 0 Emergency Hospital Admission (emergent condition) X- 1 10 Simple Discharge Coordination '[]'  - 0 Complex (extensive) Discharge Coordination PROCESS - Special Needs '[]'  - 0 Pediatric / Minor Patient Management '[]'  - 0 Isolation Patient Management '[]'  - 0 Hearing / Language / Visual special needs '[]'  - 0 Assessment of Community assistance (transportation, D/C planning, etc.) '[]'  - 0 Additional assistance / Altered mentation '[]'  - 0 Support Surface(s)  Assessment (bed, cushion, seat, etc.) INTERVENTIONS - Wound Cleansing / Measurement X - Simple Wound Cleansing - one wound 1 5 '[]'  - 0 Complex Wound Cleansing - multiple  wounds X- 1 5 Wound Imaging (photographs - any number of wounds) '[]'  - 0 Wound Tracing (instead of photographs) X- 1 5 Simple Wound Measurement - one wound '[]'  - 0 Complex Wound Measurement - multiple wounds INTERVENTIONS - Wound Dressings '[]'  - 0 Small Wound Dressing one or multiple wounds X- 1 15 Medium Wound Dressing one or multiple wounds '[]'  - 0 Large Wound Dressing one or multiple wounds '[]'  - 0 Application of Medications - topical '[]'  - 0 Application of Medications - injection INTERVENTIONS - Miscellaneous '[]'  - 0 External ear exam '[]'  - 0 Specimen Collection (cultures, biopsies, blood, body fluids, etc.) '[]'  - 0 Specimen(s) / Culture(s) sent or taken to Lab for analysis '[]'  - 0 Patient Transfer (multiple staff / Civil Service fast streamer / Similar devices) '[]'  - 0 Simple Staple / Suture removal (25 or less) '[]'  - 0 Complex Staple / Suture removal (26 or more) '[]'  - 0 Hypo / Hyperglycemic Management (close monitor of Blood Glucose) '[]'  - 0 Ankle / Brachial Index (ABI) - do not check if billed separately X- 1 5 Vital Signs Has the patient been seen at the hospital within the last three years: Yes Total Score: 125 Level Of Care: New/Established - Level 4 Electronic Signature(s) Signed: 06/13/2020 4:56:08 PM By: Samantha Duncan Entered By: Samantha Duncan on 06/13/2020 15:53:51 -------------------------------------------------------------------------------- Encounter Discharge Information Details Patient Name: Date of Service: Samantha Duncan 06/13/2020 3:15 PM Medical Record Number: [id number] Patient Account Number: [id number] Date of Birth/Sex: Treating RN: 05/01/1956 (65 y.o. Samantha Duncan Primary Care Jakeya Gherardi: Samantha Duncan Other Clinician: Referring Shontelle Muska: Treating Rosa Gambale/Extender: Samantha Duncan in Treatment: 15 Encounter Discharge Information Items Discharge Condition: Stable Ambulatory Status: Wheelchair Discharge Destination: Home Transportation: Private Auto Accompanied By: son Schedule Follow-up Appointment: Yes Clinical Summary of Care: Electronic Signature(s) Signed: 06/13/2020 4:56:08 PM By: Samantha Duncan Entered By: Samantha Duncan on 06/13/2020 16:29:52 -------------------------------------------------------------------------------- Lower Extremity Assessment Details Patient Name: Date of Service: Samantha Duncan, Samantha Duncan 06/13/2020 3:15 PM Medical Record Number: [id number] Patient Account Number: [id number] Date of Birth/Sex: Treating RN: [birth year] (65 y.o. Samantha Duncan Primary Care Sharyn Brilliant: Samantha Duncan Other Clinician: Referring Shaeley Segall: Treating Easton Sivertson/Extender: Samantha Duncan in Treatment: 15 Edema Assessment Assessed: Shirlyn Goltz: No] Patrice Paradise: No] Edema: [Left: Ye] [Right: s] Calf Left: Right: Point of Measurement: From Medial Instep 26 cm Ankle Left: Right: Point of Measurement: From Medial Instep 19 cm Vascular Assessment Pulses: Dorsalis Pedis Palpable: [Left:Yes] Electronic Signature(s) Signed: 07/02/2020 4:02:54 PM By: Samantha Hurst RN, BSN Entered By: Samantha Duncan on 06/13/2020 15:34:35 -------------------------------------------------------------------------------- Multi Wound Chart Details Patient Name: Date of Service: Samantha Duncan. 06/13/2020 3:15 PM Medical Record Number: [id number] Patient Account Number: [id number] Date of Birth/Sex: Treating RN: [birth year] (65 y.o. Samantha Duncan, Tammi Klippel Primary Care Hallie Ertl: Samantha Duncan Other Clinician: Referring Ayleah Hofmeister: Treating Deron Poole/Extender: Samantha Duncan in Treatment: 15 Vital Signs Height(in): 61 Pulse(bpm): 70 Weight(lbs): 105 Blood Pressure(mmHg): 157/77 Body Mass Index(BMI):  20 Temperature(F): 98.0 Respiratory Rate(breaths/min): 16 Photos: [1:No Photos Left Calcaneus] [N/A:N/A N/A] Wound Location: [1:Blister] [N/A:N/A] Wounding Event: [1:Pressure Ulcer] [N/A:N/A] Primary Etiology: [1:Hypertension] [N/A:N/A] Comorbid History: [1:01/10/2020] [N/A:N/A] Date Acquired: [1:15] [N/A:N/A] Weeks of Treatment: [1:Open] [N/A:N/A] Wound Status: [1:0.3x1x0.1] [N/A:N/A] Measurements L x W x D (cm) [1:0.236] [N/A:N/A] A (cm) : rea [1:0.024] [N/A:N/A] Volume (cm) : [1:97.10%] [N/A:N/A] % Reduction in A rea: [1:97.10%] [N/A:N/A] % Reduction in Volume: [1:Unstageable/Unclassified] [N/A:N/A] Classification: [1:Small] [N/A:N/A] Exudate A mount: [1:Serosanguineous] [N/A:N/A] Exudate Type: [1:red, brown] [N/A:N/A]  Exudate Color: [1:Distinct, outline attached] [N/A:N/A] Wound Margin: [1:Large (67-100%)] [N/A:N/A] Granulation A mount: [1:Pink] [N/A:N/A] Granulation Quality: [1:Small (1-33%)] [N/A:N/A] Necrotic A mount: [1:Fat Layer (Subcutaneous Tissue): Yes N/A] Exposed Structures: [1:Fascia: No Tendon: No Muscle: No Joint: No Bone: No Large (67-100%)] [N/A:N/A] Treatment Notes Electronic Signature(s) Signed: 06/13/2020 4:36:20 PM By: Linton Ham MD Signed: 06/13/2020 4:56:08 PM By: Samantha Duncan Entered By: Linton Ham on 06/13/2020 16:25:00 -------------------------------------------------------------------------------- Multi-Disciplinary Care Plan Details Patient Name: Date of Service: Samantha Duncan. 06/13/2020 3:15 PM Medical Record Number: [id number] Patient Account Number: [id number] Date of Birth/Sex: Treating RN: Sep 08, 1955 (65 y.o. Samantha Duncan, Tammi Klippel Primary Care Raja Liska: Samantha Duncan Other Clinician: Referring Ohm Dentler: Treating Beth Goodlin/Extender: Samantha Duncan in Treatment: 15 Active Inactive Pressure Nursing Diagnoses: Potential for impaired tissue integrity related to pressure, friction, moisture, and  shear Goals: Patient will remain free from development of additional pressure ulcers Date Initiated: 02/29/2020 Date Inactivated: 03/27/2020 Target Resolution Date: 03/29/2020 Goal Status: Met Patient/caregiver will verbalize understanding of pressure ulcer management Date Initiated: 02/29/2020 Target Resolution Date: 06/28/2020 Goal Status: Active Interventions: Assess: immobility, friction, shearing, incontinence upon admission and as needed Assess potential for pressure ulcer upon admission and as needed Provide education on pressure ulcers Treatment Activities: Patient referred for pressure reduction/relief devices : 02/29/2020 T ordered outside of clinic : 02/29/2020 est Notes: Wound/Skin Impairment Nursing Diagnoses: Knowledge deficit related to ulceration/compromised skin integrity Goals: Patient/caregiver will verbalize understanding of skin care regimen Date Initiated: 02/29/2020 Target Resolution Date: 06/28/2020 Goal Status: Active Interventions: Assess patient/caregiver ability to obtain necessary supplies Assess patient/caregiver ability to perform ulcer/skin care regimen upon admission and as needed Provide education on ulcer and skin care Treatment Activities: Skin care regimen initiated : 02/29/2020 Topical wound management initiated : 02/29/2020 Notes: Electronic Signature(s) Signed: 06/13/2020 4:56:08 PM By: Samantha Duncan Entered By: Samantha Duncan on 06/13/2020 15:53:00 -------------------------------------------------------------------------------- Pain Assessment Details Patient Name: Date of Service: Samantha Duncan, Samantha Duncan 06/13/2020 3:15 PM Medical Record Number: [id number] Patient Account Number: [id number] Date of Birth/Sex: Treating RN: [birth year] (65 y.o. Samantha Duncan Primary Care Steaven Wholey: Samantha Duncan Other Clinician: Referring Azucena Dart: Treating Vint Pola/Extender: Samantha Duncan in Treatment: 15 Active Problems Location of  Pain Severity and Description of Pain Patient Has Paino No Site Locations Pain Management and Medication Current Pain Management: Electronic Signature(s) Signed: 07/02/2020 4:02:54 PM By: Samantha Hurst RN, BSN Entered By: Samantha Duncan on 06/13/2020 15:34:25 -------------------------------------------------------------------------------- Patient/Caregiver Education Details Patient Name: Date of Service: Samantha Duncan 1/20/2022andnbsp3:15 PM Medical Record Number: [id number] Patient Account Number: [id number] Date of Birth/Gender: Treating RN: [birth year] (65 y.o. Samantha Duncan Primary Care Physician: Samantha Duncan Other Clinician: Referring Physician: Treating Physician/Extender: Samantha Duncan in Treatment: 15 Education Assessment Education Provided To: Patient Education Topics Provided Pressure: Handouts: Pressure Ulcers: Care and Offloading, Pressure Ulcers: Care and Offloading 2 Methods: Explain/Verbal, Printed Responses: Reinforcements needed Electronic Signature(s) Signed: 06/13/2020 4:56:08 PM By: Samantha Duncan Entered By: Samantha Duncan on 06/13/2020 15:53:12 -------------------------------------------------------------------------------- Wound Assessment Details Patient Name: Date of Service: Samantha Duncan, Samantha Duncan 06/13/2020 3:15 PM Medical Record Number: [id number] Patient Account Number: [id number] Date of Birth/Sex: Treating RN: [birth year] (65 y.o. Samantha Duncan Primary Care Eulice Rutledge: Samantha Duncan Other Clinician: Referring Bilal Manzer: Treating Jann Ra/Extender: Samantha Duncan in Treatment: 15 Wound Status Wound Number: 1 Primary Etiology: Pressure Ulcer Wound Location: Left Calcaneus Wound Status: Open Wounding Event: Blister Comorbid History: Hypertension Date Acquired: 01/10/2020 Weeks Of Treatment: 15 Clustered Wound: No Photos  Photo Uploaded By: Mikeal Hawthorne on 06/18/2020  11:42:56 Wound Measurements Length: (cm) 0.3 Width: (cm) 1 Depth: (cm) 0.1 Area: (cm) 0.236 Volume: (cm) 0.024 % Reduction in Area: 97.1% % Reduction in Volume: 97.1% Epithelialization: Large (67-100%) Tunneling: No Undermining: No Wound Description Classification: Unstageable/Unclassified Wound Margin: Distinct, outline attached Exudate Amount: Small Exudate Type: Serosanguineous Exudate Color: red, brown Foul Odor After Cleansing: No Slough/Fibrino Yes Wound Bed Granulation Amount: Large (67-100%) Exposed Structure Granulation Quality: Pink Fascia Exposed: No Necrotic Amount: Small (1-33%) Fat Layer (Subcutaneous Tissue) Exposed: Yes Necrotic Quality: Adherent Slough Tendon Exposed: No Muscle Exposed: No Joint Exposed: No Bone Exposed: No Treatment Notes Wound #1 (Calcaneus) Wound Laterality: Left Cleanser Normal Saline Discharge Instruction: Cleanse the wound with Normal Saline prior to applying a clean dressing using gauze sponges, not tissue or cotton balls. Peri-Wound Care Topical Primary Dressing Hydrofera Blue Classic Foam, 2x2 in Discharge Instruction: Moisten with saline prior to applying to wound bed Secondary Dressing Woven Gauze Sponge, Non-Sterile 4x4 in Discharge Instruction: Apply over primary dressing as directed. Secured With [employer], 4.5x3.1 (in/yd) Discharge Instruction: Secure with Kerlix , start wrapping at base of toes to help with foot edema Compression Wrap Compression Stockings Add-Ons Electronic Signature(s) Signed: 07/02/2020 4:02:54 PM By: Samantha Hurst RN, BSN Entered By: Samantha Duncan on 06/13/2020 15:35:05 -------------------------------------------------------------------------------- [place] Details Patient Name: Date of Service: Samantha Duncan. 06/13/2020 3:15 PM Medical Record Number: [id number] Patient Account Number: [id number] Date of Birth/Sex: Treating RN: Apr 27, 1956 (65 y.o. Samantha Duncan Primary  Care Sura Canul: Samantha Duncan Other Clinician: Referring Teddie Mehta: Treating Harsha Yusko/Extender: Samantha Duncan in Treatment: 15 Vital Signs Time Taken: 15:33 Temperature (F): 98.0 Height (in): 61 Pulse (bpm): 70 Weight (lbs): 105 Respiratory Rate (breaths/min): 16 Body Mass Index (BMI): 19.8 Blood Pressure (mmHg): 157/77 Reference Range: 80 - 120 mg / dl Electronic Signature(s) Signed: 07/02/2020 4:02:54 PM By: Samantha Hurst RN, BSN Entered By: Samantha Duncan on 06/13/2020 15:34:15

## 2020-07-04 ENCOUNTER — Encounter (HOSPITAL_BASED_OUTPATIENT_CLINIC_OR_DEPARTMENT_OTHER): Payer: Medicare PPO | Attending: Internal Medicine | Admitting: Internal Medicine

## 2020-07-04 ENCOUNTER — Other Ambulatory Visit: Payer: Self-pay

## 2020-07-04 DIAGNOSIS — Z8673 Personal history of transient ischemic attack (TIA), and cerebral infarction without residual deficits: Secondary | ICD-10-CM | POA: Diagnosis not present

## 2020-07-04 DIAGNOSIS — L89620 Pressure ulcer of left heel, unstageable: Secondary | ICD-10-CM | POA: Insufficient documentation

## 2020-07-04 DIAGNOSIS — I890 Lymphedema, not elsewhere classified: Secondary | ICD-10-CM | POA: Insufficient documentation

## 2020-07-04 DIAGNOSIS — L97528 Non-pressure chronic ulcer of other part of left foot with other specified severity: Secondary | ICD-10-CM | POA: Insufficient documentation

## 2020-07-04 NOTE — Progress Notes (Signed)
[name], Samantha R. ([id number]) Visit Report for 07/04/2020 HPI Details Patient Name: Date of Service: Samantha Duncan, [name] 07/04/2020 3:00 PM Medical Record Number: [id number] Patient Account Number: [id number] Date of Birth/Sex: Treating RN: 1956-05-15 (65 y.o. 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 Her problem started in August when she suffered a fall in the bathroom. She was in the ER on 01/13/2020 with CT scan showing nondisplaced sacral and pubic rami fractures. I do not think she was admitted but she represented with severe pain on 8/27 and arrangements were made for her to go to [facility]. At some point somebody put an Ace wrap on her left leg which may have been in the hospital. However the next time her son saw her the same Ace wrap was still in place now with hemorrhagic blisters over the anterior ankle and a black eschared wound on the left heel. Based on a trip to the ER 01/30/2020 the heel looks like a deep tissue injury. The wrap had denuded skin on the left anterior ankle. An x-ray was negative. The patient is now back at home. She has a roommate and home health aides and her son. They do not have a lot of support by the sound of things. The patient originally walked with an AFO brace because of left foot drop but she has not been using that out of fear it would put pressure on the heel and I agree with that. However she is anxious to pursue physical therapy if that is possible. It was not really clear to our staff what they have been using on this wound although she does have home health. In further discussion I think it was Betadine back wet to dry dressings. On the  anterior foot/ankle they have been using antibiotic ointments. ABI on the left was 0.92 03/07/2020; severe wrap injury as described above. There is an area on the dorsal aspect of her ankle at the crease with her foot as well as a more impressive area over the heel. The area on the ankle looks as though it is trying to close however the area on the heel still requires an extensive debridement of necrotic tissue fortunately there is been no evidence of infection. Using Iodoflex 10/21; the patient has 1 small open area on the dorsal aspect of her ankle. I think this is feeling in we have been using Iodosorb. The heel still has a very necrotic surface we have been using Iodoflex on this 03/27/2020 on evaluation today patient appears to be doing well in regard to her ulcers. The dorsal foot actually appears to be healed based on what I am seeing today. The heel is improving with the Iodoflex I feel like this is doing a great job. 04/17/2020 on evaluation today patient appears to be doing well with regard to her foot ulcer on the heel. Fortunately there is no signs of active infection she is healing quite nicely which is great news. No fevers, chills, nausea, vomiting, or diarrhea. 05/01/2020 on evaluation today patient appears to actually be doing quite well in regard to her heel ulcer. I do believe the Iodoflex is doing a great job for her. There does not appear to be any signs of active infection at this  time. 05/30/2020; patient has not been here in a while. Been using Iodoflex on this wound comes in with about 50% of this hyper granulated. 1/20; patient with a wound on her left heel this may have been caused by a Ace wrap that was left alone for an inordinately long period of time. This is just about closed currently. We use silver nitrate on this last time because of hypergranulation change the dressing to [facility] and this really looks just about closed 2/10; the area is finally closed. This  was initially a large wound felt to be secondary to a prolonged compression wrap injury. She is also weak on this side from a previous stroke. She is going to get an AFO brace for foot drop. Electronic Signature(s) Signed: 07/04/2020 5:13:40 PM By: Baltazar Najjar MD Entered By: Baltazar Najjar on 07/04/2020 16:58:48 -------------------------------------------------------------------------------- Physical Exam Details Patient Name: Date of Service: Samantha Brothers. 07/04/2020 3:00 PM Medical Record Number: [id number] Patient Account Number: [id number] Date of Birth/Sex: Treating RN: Feb 11, 1956 (65 y.o. [name] Primary Care Provider: Burton [name] Other Clinician: Referring Provider: Treating Provider/Extender: Nichola Sizer in Treatment: 18 Constitutional Patient is hypertensive.. Pulse regular and within target range for patient.Marland Kitchen Respirations regular, non-labored and within target range.. Temperature is normal and within the target range for the patient.Marland Kitchen Appears in no distress. Notes Wound exam; the wound is totally closed on the left heel. She has some dry flaking skin I did not remove. There is no evidence of surrounding infection some loss of subcutaneous tissue which is not unusual after healing marked large inflammatory wounds Electronic Signature(s) Signed: 07/04/2020 5:13:40 PM By: Baltazar Najjar MD Entered By: Baltazar Najjar on 07/04/2020 16:59:36 -------------------------------------------------------------------------------- Physician Orders Details Patient Name: Date of Service: Samantha Rooks R. 07/04/2020 3:00 PM Medical Record Number: [id number] Patient Account Number: [id number] Date of Birth/Sex: Treating RN: 1956-03-25 (65 y.o. [name] Primary Care Provider: Burton [name] Other Clinician: Referring Provider: Treating Provider/Extender: Nichola Sizer in Treatment: 18 Verbal / Phone Orders:  No Diagnosis Coding ICD-10 Coding Code Description [phone] Pressure ulcer of left heel, unstageable L97.528 Non-pressure chronic ulcer of other part of left foot with other specified severity R26.0 Ataxic gait Discharge From [facility] Services Discharge from Wound Care Center - Call if any future wound care needs. Edema Control - Lymphedema / SCD / Other Moisturize legs daily. Off-Loading Other: - Continue to float heel while resting. Additional Orders / Instructions Other: - Check feet daily for any openings. Pad the left heel for protection for at least 2 weeks. Home Health Discontinue home health for wound care. [facility] discharge from wound care standpoint. Electronic Signature(s) Signed: 07/04/2020 5:13:40 PM By: Baltazar Najjar MD Signed: 07/04/2020 6:08:01 PM By: Shawn Stall Entered By: Shawn Stall on 07/04/2020 16:02:17 -------------------------------------------------------------------------------- Problem List Details Patient Name: Date of Service: Samantha Rooks R. 07/04/2020 3:00 PM Medical Record Number: [id number] Patient Account Number: [id number] Date of Birth/Sex: Treating RN: 11/06/1955 (65 y.o. [name] Primary Care Provider: Other Clinician: Burton [name] Referring Provider: Treating Provider/Extender: Nichola Sizer in Treatment: 18 Active Problems ICD-10 Encounter Code Description Active Date MDM Diagnosis L89.620 Pressure ulcer of left heel, unstageable 02/29/2020 No Yes L97.528 Non-pressure chronic ulcer of other part of left foot with other specified 02/29/2020 No Yes severity R26.0 Ataxic gait 02/29/2020 No Yes Inactive Problems Resolved Problems Electronic Signature(s) Signed: 07/04/2020 5:13:40 PM By: Baltazar Najjar MD Entered By: Leanord Hawking,  Michael on 07/04/2020 16:57:31 -------------------------------------------------------------------------------- Progress Note Details Patient Name: Date of  Service: Samantha Duncan, Samantha Duncan 07/04/2020 3:00 PM Medical Record Number: [id number] Patient Account Number: [id number] Date of Birth/Sex: Treating RN: 1955/09/29 (65 y.o. 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 Her problem started in August when she suffered a fall in the bathroom. She was in the ER on 01/13/2020 with CT scan showing nondisplaced sacral and pubic rami fractures. I do not think she was admitted but she represented with severe pain on 8/27 and arrangements were made for her to go to [facility]. At some point somebody put an Ace wrap on her left leg which may have been in the hospital. However the next time her son saw her the same Ace wrap was still in place now with hemorrhagic blisters over the anterior ankle and a black eschared wound on the left heel. Based on a trip to the ER 01/30/2020 the heel looks like a deep tissue injury. The wrap had denuded skin on the left anterior ankle. An x-ray was negative. The patient is now back at home. She has a roommate and home health aides and her son. They do not have a lot of support by the sound of things. The patient originally walked with an AFO brace because of left foot drop but she has not been using that out of fear it would put pressure on the heel and I agree with that. However she is anxious to pursue physical therapy if that is possible. It was not really clear to our staff what they have been using on this wound although she does have home health. In further discussion I think it was Betadine back wet to dry dressings. On the anterior foot/ankle they have been using antibiotic ointments. ABI on the left was  0.92 03/07/2020; severe wrap injury as described above. There is an area on the dorsal aspect of her ankle at the crease with her foot as well as a more impressive area over the heel. The area on the ankle looks as though it is trying to close however the area on the heel still requires an extensive debridement of necrotic tissue fortunately there is been no evidence of infection. Using Iodoflex 10/21; the patient has 1 small open area on the dorsal aspect of her ankle. I think this is feeling in we have been using Iodosorb. The heel still has a very necrotic surface we have been using Iodoflex on this 03/27/2020 on evaluation today patient appears to be doing well in regard to her ulcers. The dorsal foot actually appears to be healed based on what I am seeing today. The heel is improving with the Iodoflex I feel like this is doing a great job. 04/17/2020 on evaluation today patient appears to be doing well with regard to her foot ulcer on the heel. Fortunately there is no signs of active infection she is healing quite nicely which is great news. No fevers, chills, nausea, vomiting, or diarrhea. 05/01/2020 on evaluation today patient appears to actually be doing quite well in regard to her heel ulcer. I do believe the Iodoflex is doing a great job for her. There does not appear to be any signs of active infection at this time.  05/30/2020; patient has not been here in a while. Been using Iodoflex on this wound comes in with about 50% of this hyper granulated. 1/20; patient with a wound on her left heel this may have been caused by a Ace wrap that was left alone for an inordinately long period of time. This is just about closed currently. We use silver nitrate on this last time because of hypergranulation change the dressing to [facility] and this really looks just about closed 2/10; the area is finally closed. This was initially a large wound felt to be secondary to a prolonged compression wrap  injury. She is also weak on this side from a previous stroke. She is going to get an AFO brace for foot drop. Objective Constitutional Patient is hypertensive.. Pulse regular and within target range for patient.Marland Kitchen Respirations regular, non-labored and within target range.. Temperature is normal and within the target range for the patient.Marland Kitchen Appears in no distress. Vitals Time Taken: 3:43 PM, Height: 61 in, Weight: 105 lbs, BMI: 19.8, Temperature: 99.1 F, Pulse: 65 bpm, Respiratory Rate: 16 breaths/min, Blood Pressure: 150/76 mmHg. General Notes: Wound exam; the wound is totally closed on the left heel. She has some dry flaking skin I did not remove. There is no evidence of surrounding infection some loss of subcutaneous tissue which is not unusual after healing marked large inflammatory wounds Integumentary (Hair, Skin) Wound #1 status is Healed - Epithelialized. Original cause of wound was Blister. The wound is located on the Left Calcaneus. The wound measures 0cm length x 0cm width x 0cm depth; 0cm^2 area and 0cm^3 volume. There is no tunneling or undermining noted. There is a none present amount of drainage noted. The wound margin is distinct with the outline attached to the wound base. There is no granulation within the wound bed. There is no necrotic tissue within the wound bed. 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 Edema Control - Lymphedema / SCD / Other: Moisturize legs daily. Off-Loading: Other: - Continue to float heel while resting. Additional Orders / Instructions: Other: - Check feet daily for any openings. Pad the left heel for protection for at least 2 weeks. Home Health: Discontinue home health for wound care. [facility]  discharge from wound care standpoint. 1. The patient can be discharged from the clinic 2. We recommended foam on her heel or a heel cup to protect her heel from her AFO brace. 3. She has a English as a second language teacher and she may continue to use this for the next 2 or 3 months when she is in bed Electronic Signature(s) Signed: 07/04/2020 5:13:40 PM By: Baltazar Najjar MD Entered By: Baltazar Najjar on 07/04/2020 17:00:26 -------------------------------------------------------------------------------- SuperBill Details Patient Name: Date of Service: Samantha Brothers 07/04/2020 Medical Record Number: [id number] Patient Account Number: [id number] Date of Birth/Sex: Treating RN: March 15, 1956 (65 y.o. [name] Primary Care Provider: Burton [name] Other Clinician: Referring Provider: Treating Provider/Extender: Nichola Sizer in Treatment: 18 Diagnosis Coding ICD-10 Codes Code Description [phone] Pressure ulcer of left heel, unstageable L97.528 Non-pressure chronic ulcer of other part of left foot with other specified severity R26.0 Ataxic gait Facility Procedures CPT4 Code: [id number] Description: 99214 - WOUND CARE VISIT-LEV 4 EST PT Modifier: Quantity: 1 Physician Procedures : CPT4 Code Description Modifier  [id number] - WC PHYS LEVEL 3 - EST PT ICD-10 Diagnosis Description L89.620 Pressure ulcer of left heel, unstageable Quantity: 1 Electronic Signature(s) Signed: 07/04/2020 5:13:40 PM By: Baltazar Najjar MD Entered By: Baltazar Najjar on 07/04/2020 17:01:03

## 2020-07-04 NOTE — Progress Notes (Signed)
Samantha Duncan ([id number]) Visit Report for 07/04/2020 Arrival Information Details Patient Name: Date of Service: Samantha Duncan, Samantha Duncan 07/04/2020 3:00 PM Medical Record Number: [id number] Patient Account Number: [id number] Date of Birth/Sex: Treating RN: Mar 26, 1956 (65 y.o. Samantha Duncan, Samantha Duncan Primary Care Samantha Duncan: Samantha Duncan Other Clinician: Referring Samantha Duncan: Treating Samantha Duncan/Extender: Samantha Duncan in Treatment: 18 Visit Information History Since Last Visit Added or deleted any medications: No Patient Arrived: Samantha Duncan Any new allergies or adverse reactions: No Arrival Time: 15:42 Had a fall or experienced change in No Accompanied By: son activities of daily living that may affect Transfer Assistance: None risk of falls: Patient Identification Verified: Yes Signs or symptoms of abuse/neglect since last visito No Secondary Verification Process Completed: Yes Hospitalized since last visit: No Patient Requires Transmission-Based Precautions: No Implantable device outside of the clinic excluding No Patient Has Alerts: No cellular tissue based products placed in the center since last visit: Has Dressing in Place as Prescribed: Yes Pain Present Now: No Electronic Signature(s) Signed: 07/04/2020 5:58:12 PM By: Samantha Abts RN, BSN Entered By: Samantha Duncan on 07/04/2020 15:43:10 -------------------------------------------------------------------------------- Clinic Level of Care Assessment Details Patient Name: Date of Service: Samantha Duncan 07/04/2020 3:00 PM Medical Record Number: [id number] Patient Account Number: [id number] Date of Birth/Sex: Treating RN: 1956/01/13 (65 y.o. Samantha Duncan, Samantha Duncan Primary Care Sumedh Shinsato: Samantha Duncan Other Clinician: Referring Samantha Duncan: Treating Samantha Duncan/Extender: Samantha Duncan in Treatment: 18 Clinic Level of Care Assessment Items TOOL 4 Quantity Score X- 1 0 Use when only an EandM  is performed on FOLLOW-UP visit ASSESSMENTS - Nursing Assessment / Reassessment X- 1 10 Reassessment of Co-morbidities (includes updates in patient status) X- 1 5 Reassessment of Adherence to Treatment Plan ASSESSMENTS - Wound and Skin A ssessment / Reassessment X - Simple Wound Assessment / Reassessment - one wound 1 5 []  - 0 Complex Wound Assessment / Reassessment - multiple wounds X- 1 10 Dermatologic / Skin Assessment (not related to wound area) ASSESSMENTS - Focused Assessment X- 1 5 Circumferential Edema Measurements - multi extremities X- 1 10 Nutritional Assessment / Counseling / Intervention []  - 0 Lower Extremity Assessment (monofilament, tuning fork, pulses) []  - 0 Peripheral Arterial Disease Assessment (using hand held doppler) ASSESSMENTS - Ostomy and/or Continence Assessment and Care []  - 0 Incontinence Assessment and Management []  - 0 Ostomy Care Assessment and Management (repouching, etc.) PROCESS - Coordination of Care X - Simple Patient / Family Education for ongoing care 1 15 []  - 0 Complex (extensive) Patient / Family Education for ongoing care X- 1 10 Staff obtains , Records, T Results / Process Orders est X- 1 10 Staff telephones HHA, Nursing Homes / Clarify orders / etc []  - 0 Routine Transfer to another Facility (non-emergent condition) []  - 0 Routine Hospital Admission (non-emergent condition) []  - 0 New Admissions / / Ordering NPWT Apligraf, etc. , []  - 0 Emergency Hospital Admission (emergent condition) X- 1 10 Simple Discharge Coordination []  - 0 Complex (extensive) Discharge Coordination PROCESS - Special Needs []  - 0 Pediatric / Minor Patient Management []  - 0 Isolation Patient Management []  - 0 Hearing / Language / Visual special needs []  - 0 Assessment of Community assistance (transportation, D/C planning, etc.) []  - 0 Additional assistance / Altered mentation []  - 0 Support Surface(s)  Assessment (bed, cushion, seat, etc.) INTERVENTIONS - Wound Cleansing / Measurement X - Simple Wound Cleansing - one wound 1 5 []  - 0 Complex Wound Cleansing - multiple wounds  X- 1 5 Wound Imaging (photographs - any number of wounds) []  - 0 Wound Tracing (instead of photographs) X- 1 5 Simple Wound Measurement - one wound []  - 0 Complex Wound Measurement - multiple wounds INTERVENTIONS - Wound Dressings X - Small Wound Dressing one or multiple wounds 1 10 []  - 0 Medium Wound Dressing one or multiple wounds []  - 0 Large Wound Dressing one or multiple wounds []  - 0 Application of Medications - topical []  - 0 Application of Medications - injection INTERVENTIONS - Miscellaneous []  - 0 External ear exam []  - 0 Specimen Collection (cultures, biopsies, blood, body fluids, etc.) []  - 0 Specimen(s) / Culture(s) sent or taken to Lab for analysis []  - 0 Patient Transfer (multiple staff / / Similar devices) []  - 0 Simple Staple / Suture removal (25 or less) []  - 0 Complex Staple / Suture removal (26 or more) []  - 0 Hypo / Hyperglycemic Management (close monitor of Blood Glucose) []  - 0 Ankle / Brachial Index (ABI) - do not check if billed separately X- 1 5 Vital Signs Has the patient been seen at the hospital within the last three years: Yes Total Score: 120 Level Of Care: New/Established - Level 4 Electronic Signature(s) Signed: 07/04/2020 6:08:01 PM By: Entered By: on 07/04/2020 16:03:07 -------------------------------------------------------------------------------- Encounter Discharge Information Details Patient Name: Date of Service: R. 07/04/2020 3:00 PM Medical Record Number: Patient Account Number: Date of Birth/Sex: Treating RN: Feb 01, 1956 (65 y.o. Primary Care Uchechukwu Dhawan: Other Clinician: Referring Momo Braun: Treating Cinde Ebert/Extender: in Treatment: 18 Encounter Discharge Information Items Discharge Condition: Stable Ambulatory Status: Cane Discharge Destination: Home Transportation: Private Auto Accompanied By: son Schedule Follow-up Appointment: Yes Clinical Summary of Care: Electronic Signature(s) Signed: 07/04/2020 6:08:01 PM By: Shawn Stall Entered By: Shawn Stall on 07/04/2020 16:03:44 -------------------------------------------------------------------------------- Lower Extremity Assessment Details Patient Name: Date of Service: GENESSA, BEMAN 07/04/2020 3:00 PM Medical Record Number: [id number] Patient Account Number: [id number] Date of Birth/Sex: Treating RN: 1955-11-19 (65 y.o. [name] Primary Care Saivon Prowse: Samantha Duncan Other Clinician: Referring Iver Miklas: Treating Jenita Rayfield/Extender: Samantha Duncan in Treatment: 18 Edema Assessment Assessed: 19: No] 09/01/2020: No] Edema: [Left: N] [Right: o] Calf Left: Right: Point of Measurement: From Medial Instep 26 cm Ankle Left: Right: Point of Measurement: From Medial Instep 19 cm Vascular Assessment Pulses: Dorsalis Pedis Palpable: [Left:Yes] Electronic Signature(s) Signed: 07/04/2020 5:58:12 PM By: Shawn Stall RN, BSN Entered By: 09/01/2020 on 07/04/2020 15:43:39 -------------------------------------------------------------------------------- Multi Wound Chart Details Patient Name: Date of Service: 09/01/2020 R. 07/04/2020 3:00 PM Medical Record Number: [id number] Patient Account Number: 01/18/1956 Date of Birth/Sex: Treating RN: [birth year] (65 y.o. Samantha Duncan, Samantha Duncan Primary Care Krystal Delduca: Kyra Searles Other Clinician: Referring Shakiara Lukic: Treating Citlali Gautney/Extender: Franne Forts in Treatment: 18 Vital Signs Height(in): 61 Pulse(bpm): 65 Weight(lbs): 105 Blood Pressure(mmHg): 150/76 Body Mass Index(BMI): 20 Temperature(F):  99.1 Respiratory Rate(breaths/min): 16 Photos: [1:No Photos Left Calcaneus] [N/A:N/A N/A] Wound Location: [1:Blister] [N/A:N/A] Wounding Event: [1:Pressure Ulcer] [N/A:N/A] Primary Etiology: [1:Hypertension] [N/A:N/A] Comorbid History: [1:01/10/2020] [N/A:N/A] Date Acquired: [1:18] [N/A:N/A] Weeks of Treatment: [1:Healed - Epithelialized] [N/A:N/A] Wound Status: [1:0x0x0] [N/A:N/A] Measurements L x W x D (cm) [1:0] [N/A:N/A] A (cm) : rea [1:0] [N/A:N/A] Volume (cm) : [1:100.00%] [N/A:N/A] % Reduction in A rea: [1:100.00%] [N/A:N/A] % Reduction in Volume: [1:Unstageable/Unclassified] [N/A:N/A] Classification: [1:None Present] [N/A:N/A] Exudate A mount: [1:Distinct, outline attached] [N/A:N/A]  Wound Margin: [1:None Present (0%)] [N/A:N/A] Granulation A mount: [1:None Present (0%)] [N/A:N/A] Necrotic A mount: [1:Fascia: No] [N/A:N/A] Exposed Structures: [1:Fat Layer (Subcutaneous Tissue): No Tendon: No Muscle: No Joint: No Bone: No Large (67-100%)] [N/A:N/A] Treatment Notes Electronic Signature(s) Signed: 07/04/2020 5:13:40 PM By: Baltazar Najjar MD Signed: 07/04/2020 6:08:01 PM By: Shawn Stall Entered By: Baltazar Najjar on 07/04/2020 16:57:39 -------------------------------------------------------------------------------- Multi-Disciplinary Care Plan Details Patient Name: Date of Service: [name] 07/04/2020 3:00 PM Medical Record Number: [id number] Patient Account Number: [id number] Date of Birth/Sex: Treating RN: 23-Apr-1956 (65 y.o. [name] Primary Care Shresta Risden: Samantha Duncan Other Clinician: Referring Jquan Egelston: Treating Kasia Trego/Extender: Samantha Duncan in Treatment: 18 Active Inactive Electronic Signature(s) Signed: 07/04/2020 6:08:01 PM By: Shawn Stall Entered By: Shawn Stall on 07/04/2020 16:02:42 -------------------------------------------------------------------------------- Pain Assessment Details Patient  Name: Date of Service: [name] 07/04/2020 3:00 PM Medical Record Number: [id number] Patient Account Number: [id number] Date of Birth/Sex: Treating RN: 15-Nov-1955 (65 y.o. Wynelle Link Primary Care Rayshell Goecke: Samantha Duncan Other Clinician: Referring Nasario Czerniak: Treating Ajani Schnieders/Extender: Samantha Duncan in Treatment: 18 Active Problems Location of Pain Severity and Description of Pain Patient Has Paino No Site Locations Pain Management and Medication Current Pain Management: Electronic Signature(s) Signed: 07/04/2020 5:58:12 PM By: Samantha Abts RN, BSN Entered By: Samantha Duncan on 07/04/2020 15:43:34 -------------------------------------------------------------------------------- Patient/Caregiver Education Details Patient Name: Date of Service: Lalla Brothers 2/10/2022andnbsp3:00 PM Medical Record Number: [id number] Patient Account Number: [id number] Date of Birth/Gender: Treating RN: 01/07/1956 (65 y.o. [name] Primary Care Physician: Samantha Duncan Other Clinician: Referring Physician: Treating Physician/Extender: Samantha Duncan in Treatment: 18 Education Assessment Education Provided To: Patient Education Topics Provided Wound/Skin Impairment: Handouts: Skin Care Do's and Dont's Methods: Explain/Verbal Responses: Reinforcements needed Electronic Signature(s) Signed: 07/04/2020 6:08:01 PM By: Shawn Stall Entered By: Shawn Stall on 07/04/2020 15:19:25 -------------------------------------------------------------------------------- Wound Assessment Details Patient Name: Date of Service: KESLYNN, COLOMBINI 07/04/2020 3:00 PM Medical Record Number: [id number] Patient Account Number: [id number] Date of Birth/Sex: Treating RN: [birth year] (65 y.o. Wynelle Link Primary Care Constanza Mincy: Samantha Duncan Other Clinician: Referring Julionna Marczak: Treating Mohmed Farver/Extender: Samantha Duncan in Treatment: 18 Wound Status Wound Number: 1 Primary Etiology: Pressure Ulcer Wound Location: Left Calcaneus Wound Status: Healed - Epithelialized Wounding Event: Blister Comorbid History: Hypertension Date Acquired: 01/10/2020 Weeks Of Treatment: 18 Clustered Wound: No Wound Measurements Length: (cm) Width: (cm) Depth: (cm) Area: (cm) Volume: (cm) 0 % Reduction in Area: 100% 0 % Reduction in Volume: 100% 0 Epithelialization: Large (67-100%) 0 Tunneling: No 0 Undermining: No Wound Description Classification: Unstageable/Unclassified Wound Margin: Distinct, outline attached Exudate Amount: None Present Foul Odor After Cleansing: No Slough/Fibrino No Wound Bed Granulation Amount: None Present (0%) Exposed Structure Necrotic Amount: None Present (0%) Fascia Exposed: No Fat Layer (Subcutaneous Tissue) Exposed: No Tendon Exposed: No Muscle Exposed: No Joint Exposed: No Bone Exposed: No Electronic Signature(s) Signed: 07/04/2020 5:58:12 PM By: Samantha Abts RN, BSN Entered By: Samantha Duncan on 07/04/2020 15:44:01 -------------------------------------------------------------------------------- Vitals Details Patient Name: Date of Service: [name] 07/04/2020 3:00 PM Medical Record Number: [id number] Patient Account Number: [id number] Date of Birth/Sex: Treating RN: 1955/09/13 (65 y.o. Wynelle Link Primary Care Timberly Yott: Samantha Duncan Other Clinician: Referring Gurshaan Matsuoka: Treating Glorianna Gott/Extender: Samantha Duncan in Treatment: 18 Vital Signs Time Taken: 15:43 Temperature (F): 99.1 Height (in): 61 Pulse (bpm): 65 Weight (lbs): 105 Respiratory Rate (breaths/min): 16 Body Mass Index (BMI): 19.8 Blood Pressure (  mmHg): 150/76 Reference Range: 80 - 120 mg / dl Electronic Signature(s) Signed: 07/04/2020 5:58:12 PM By: Samantha Abts RN, BSN Entered By: Samantha Duncan on 07/04/2020 15:43:29

## 2021-10-19 IMAGING — DX DG FOOT COMPLETE 3+V*L*
3 series · 3 of 3 positions shown · non-contrast
Comparison: None.

CLINICAL DATA: 64-year-old female with left foot pain and swelling.

EXAM:
LEFT FOOT - COMPLETE 3+ VIEW

[foot ap]
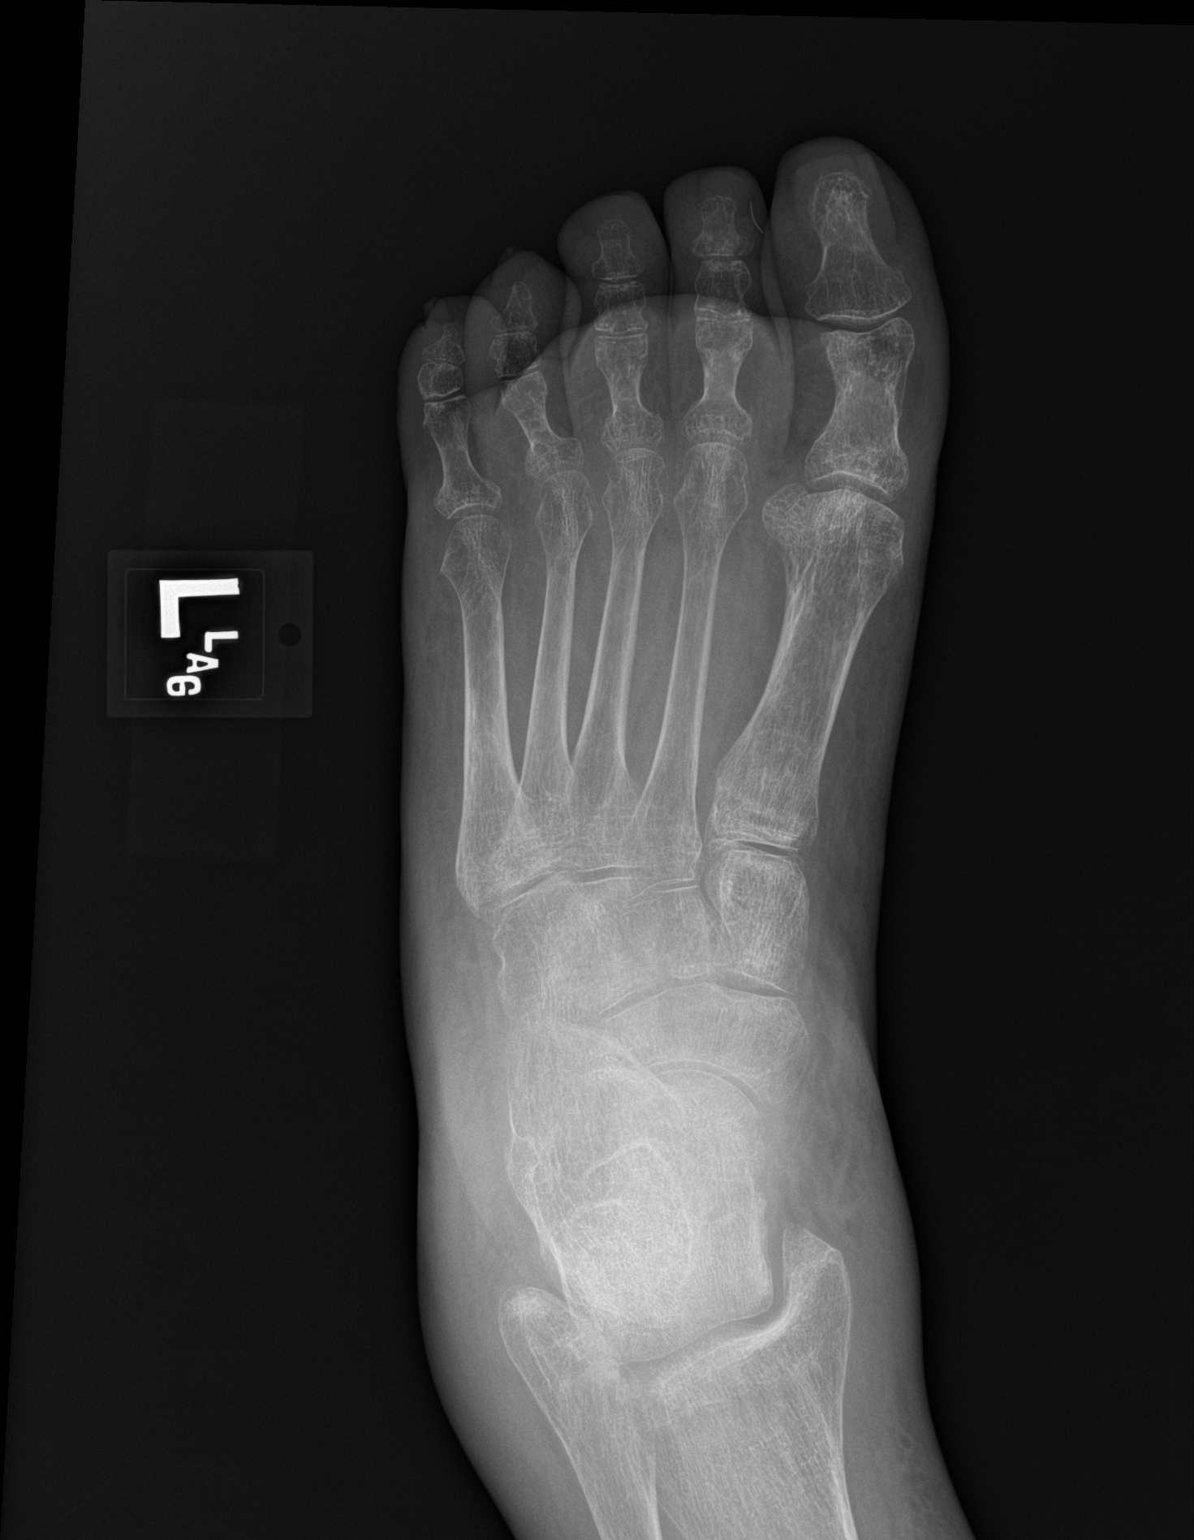

[foot obl]
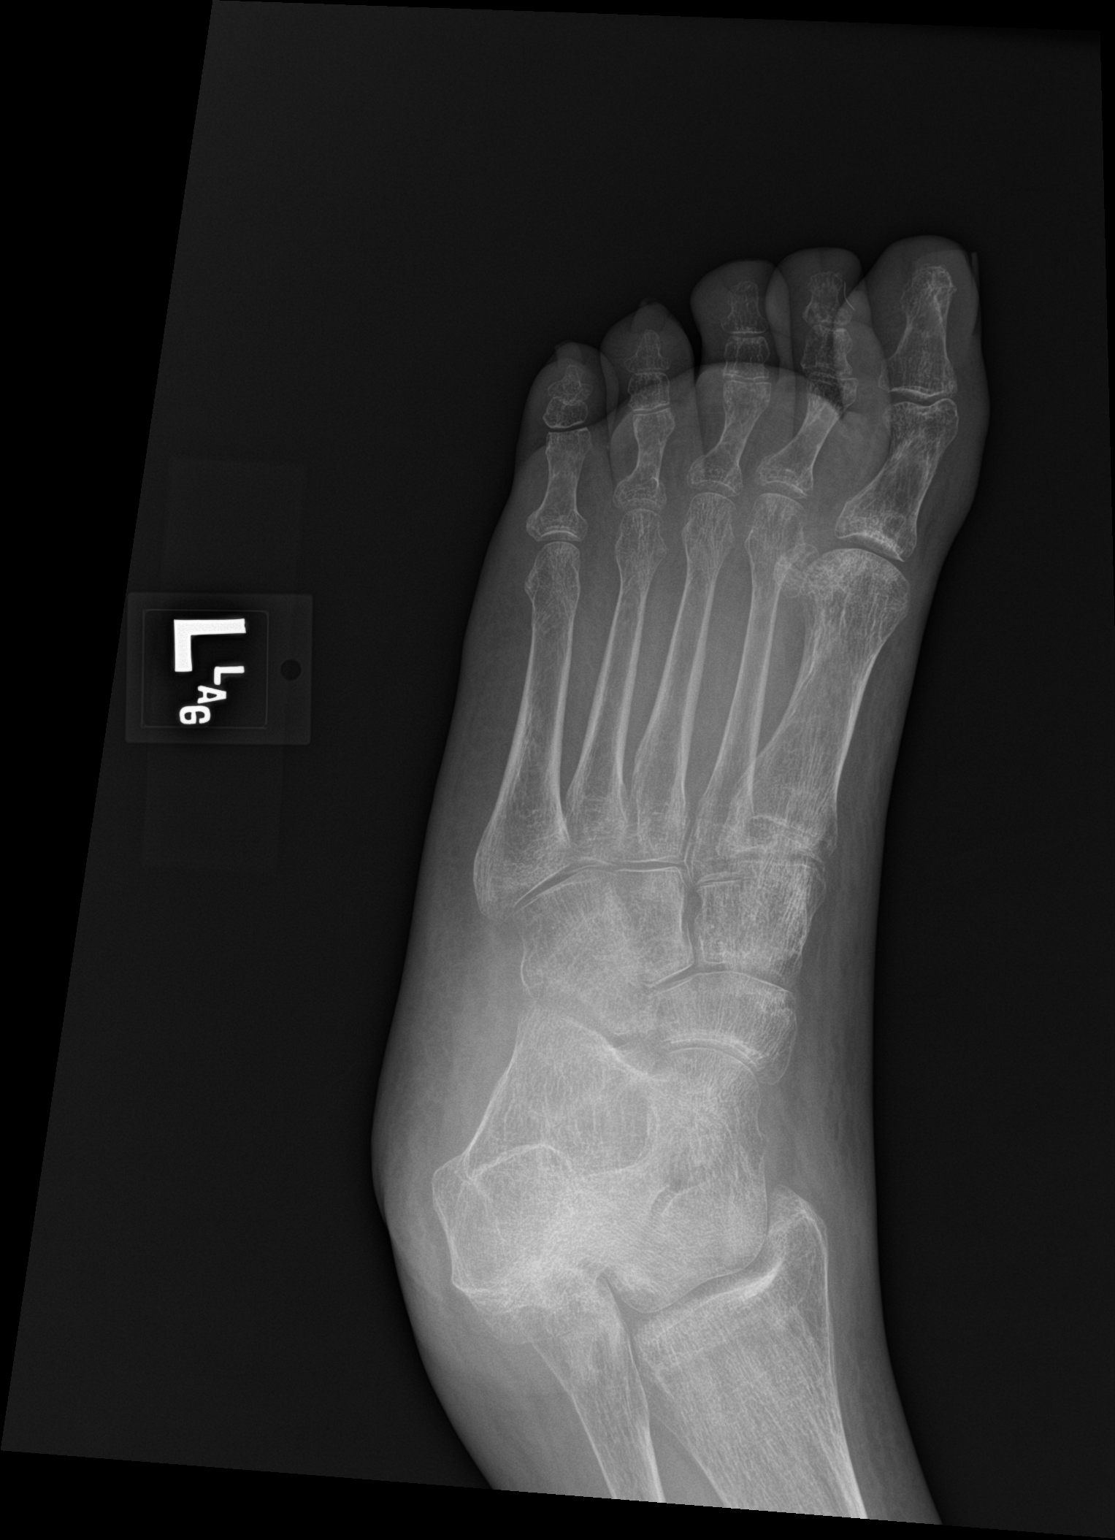

[foot lat]
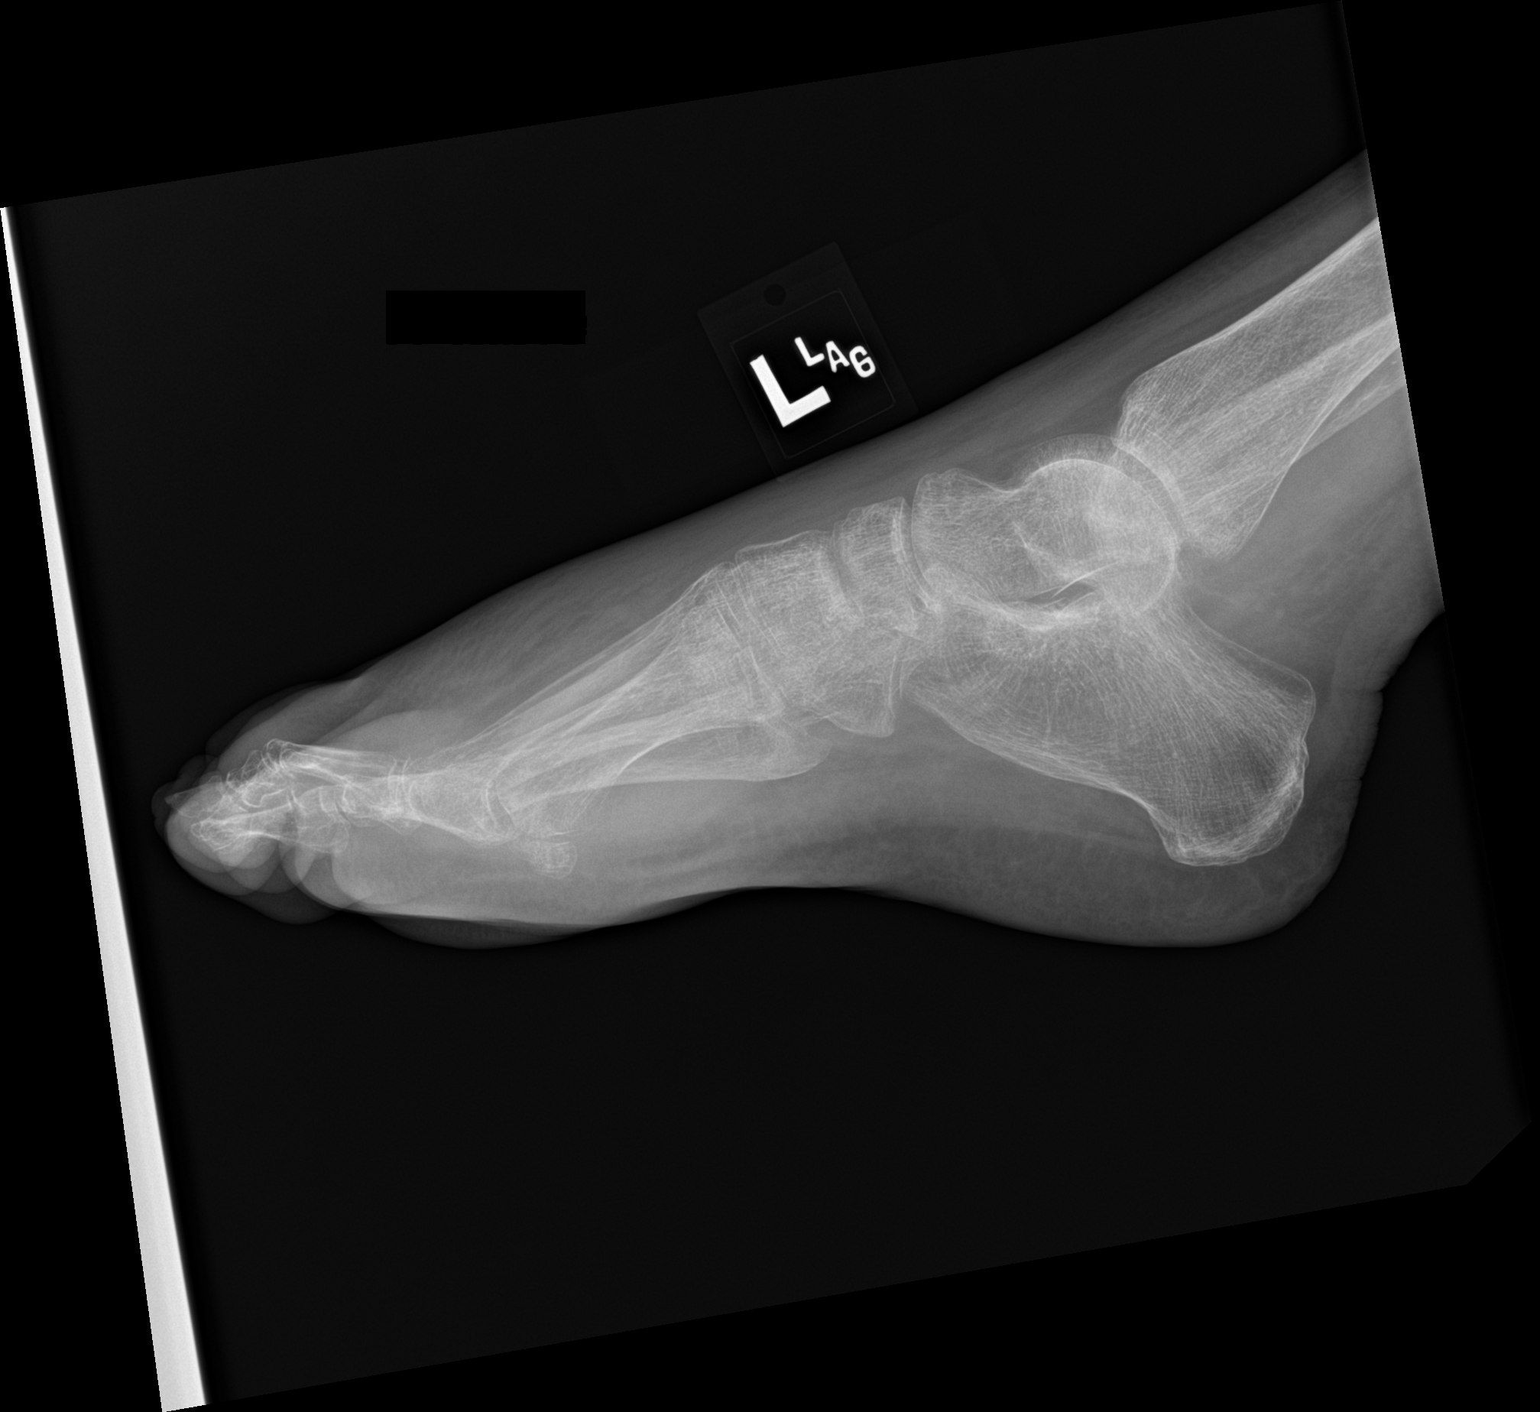

[3 of 3 positions shown; findings below may reference images not displayed]

FINDINGS: There is no acute fracture or dislocation. There is advanced
osteopenia. There is diffuse subcutaneous edema of the dorsum of the
foot which may represent cellulitis. No soft tissue gas. There is a
6 mm linear radiopaque density in the soft tissues along the medial
aspect of the distal phalanx of the second digit. Clinical
correlation is recommended.
IMPRESSION: 1. No acute fracture or dislocation.
2. Diffuse subcutaneous edema of the dorsum of the foot may
represent cellulitis.
3. A 6 mm linear radiopaque density in the soft tissues of the
second digit, likely a foreign object.

## 2021-10-30 IMAGING — DX DG ANKLE COMPLETE 3+V*L*
3 series · 3 of 3 positions shown · non-contrast
Comparison: X-ray left foot 01/19/2020, x-ray left foot 08/28/2013

CLINICAL DATA: Left foot swelling and wound infection

EXAM:
LEFT ANKLE COMPLETE - 3+ VIEW

[ankle obl]
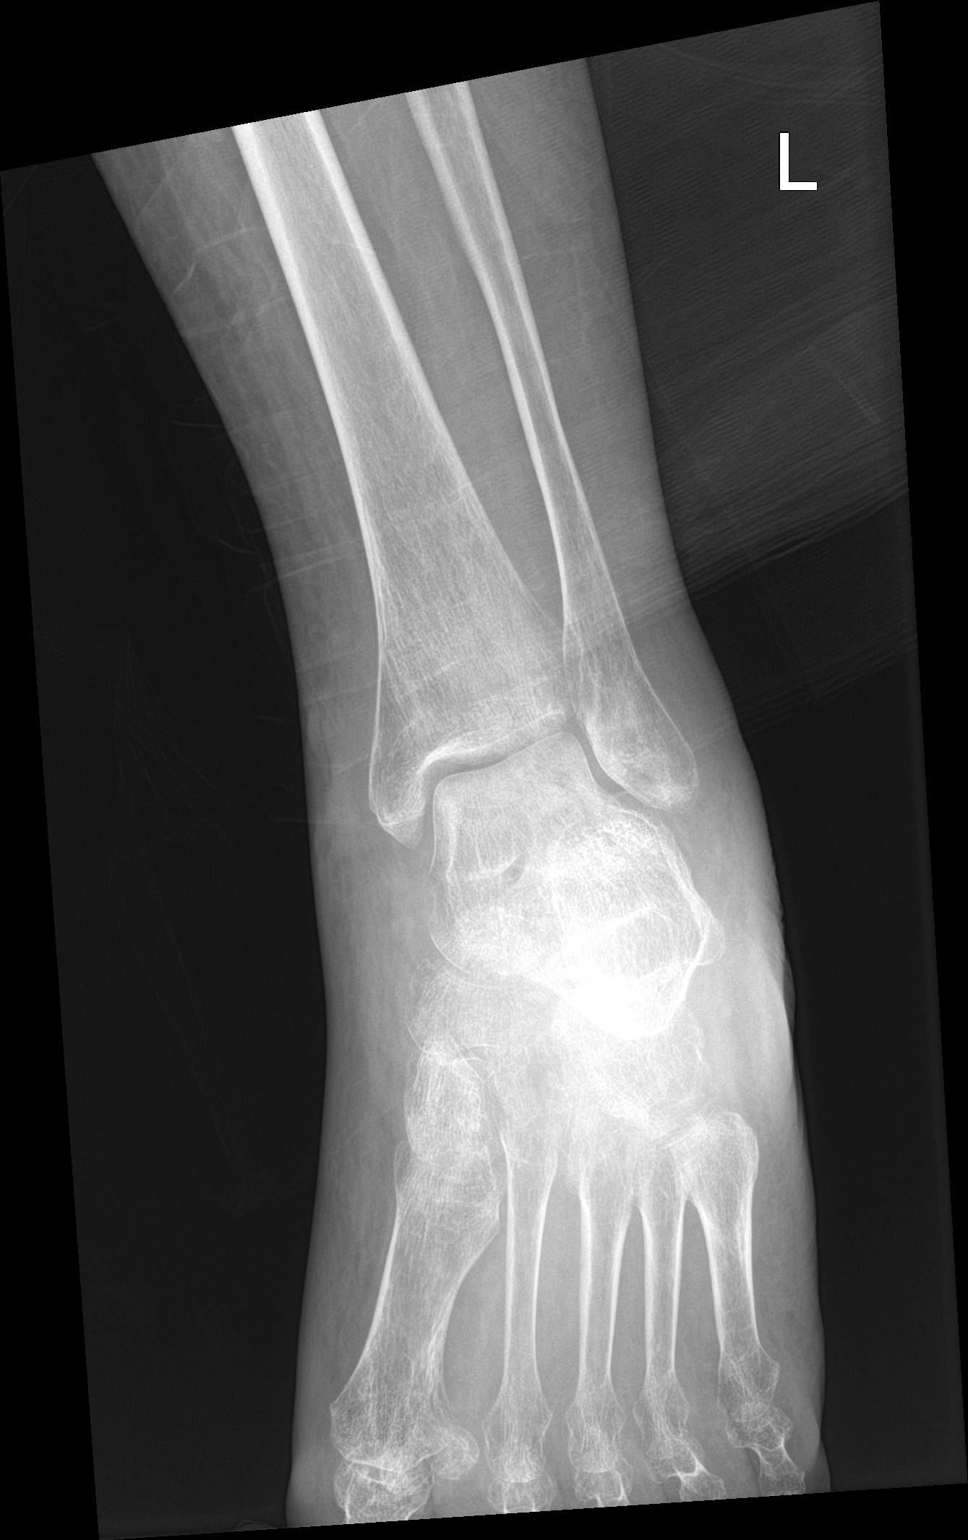

[ankle ap]
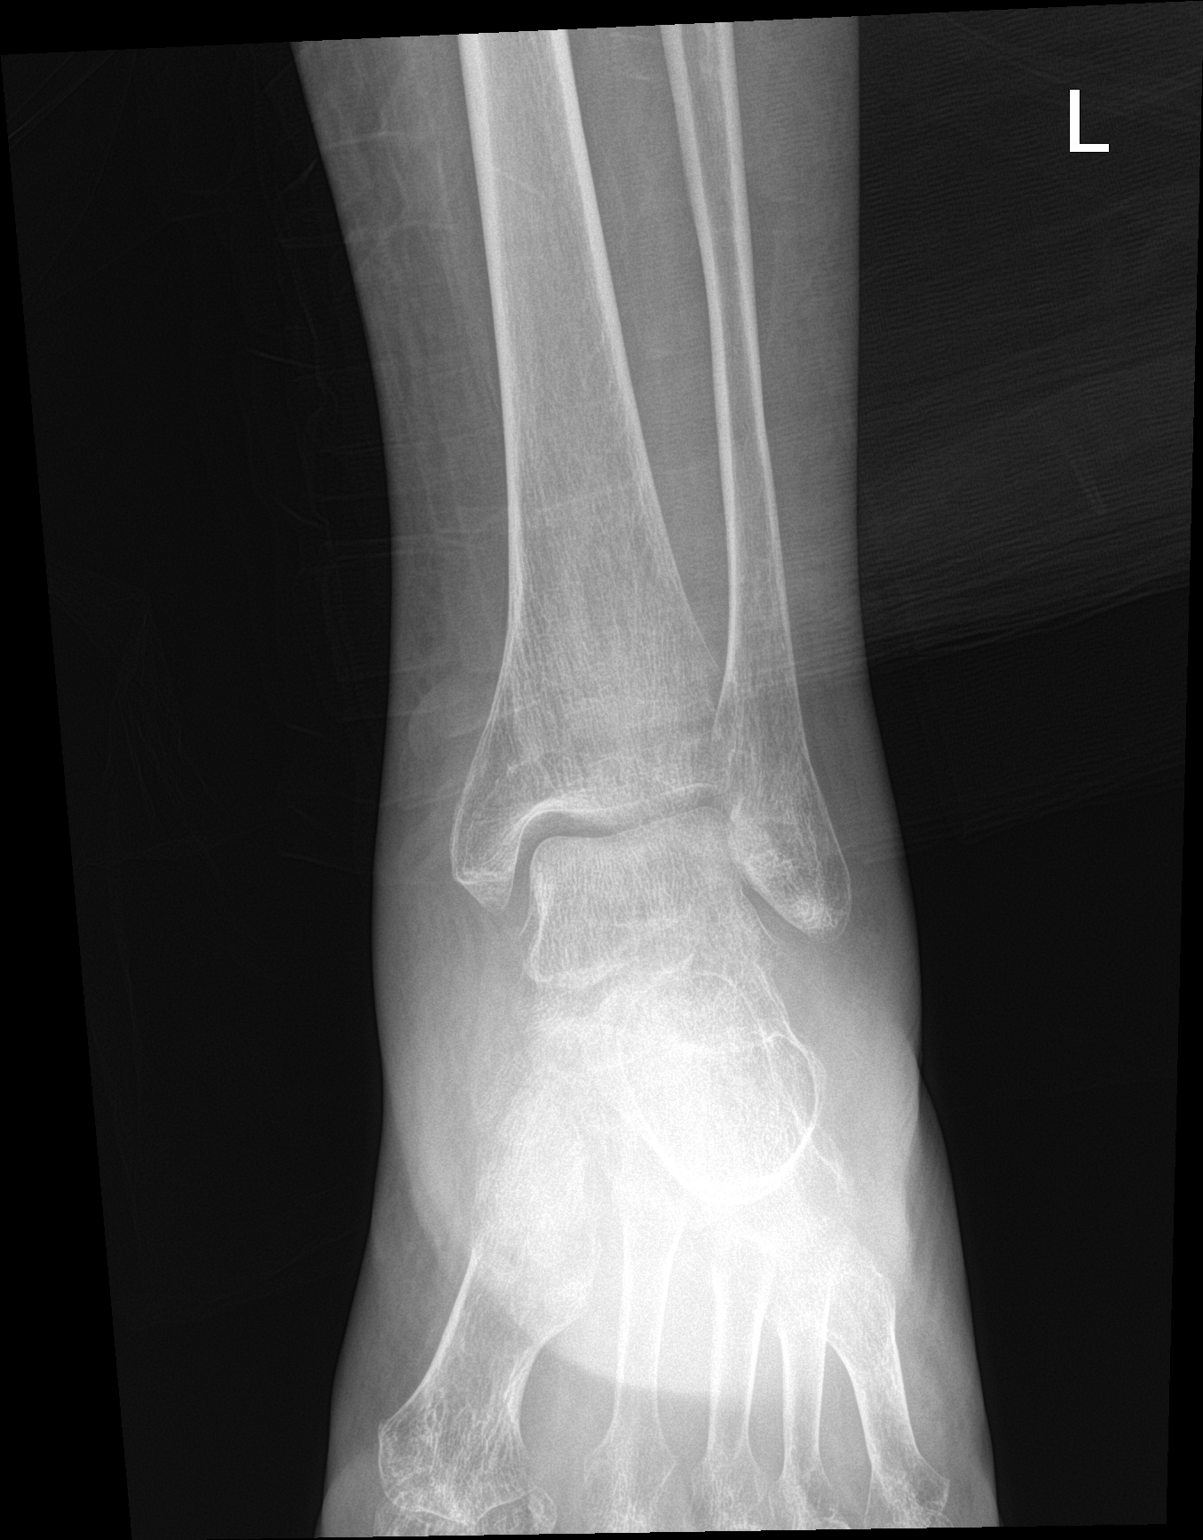

[ankle lat]
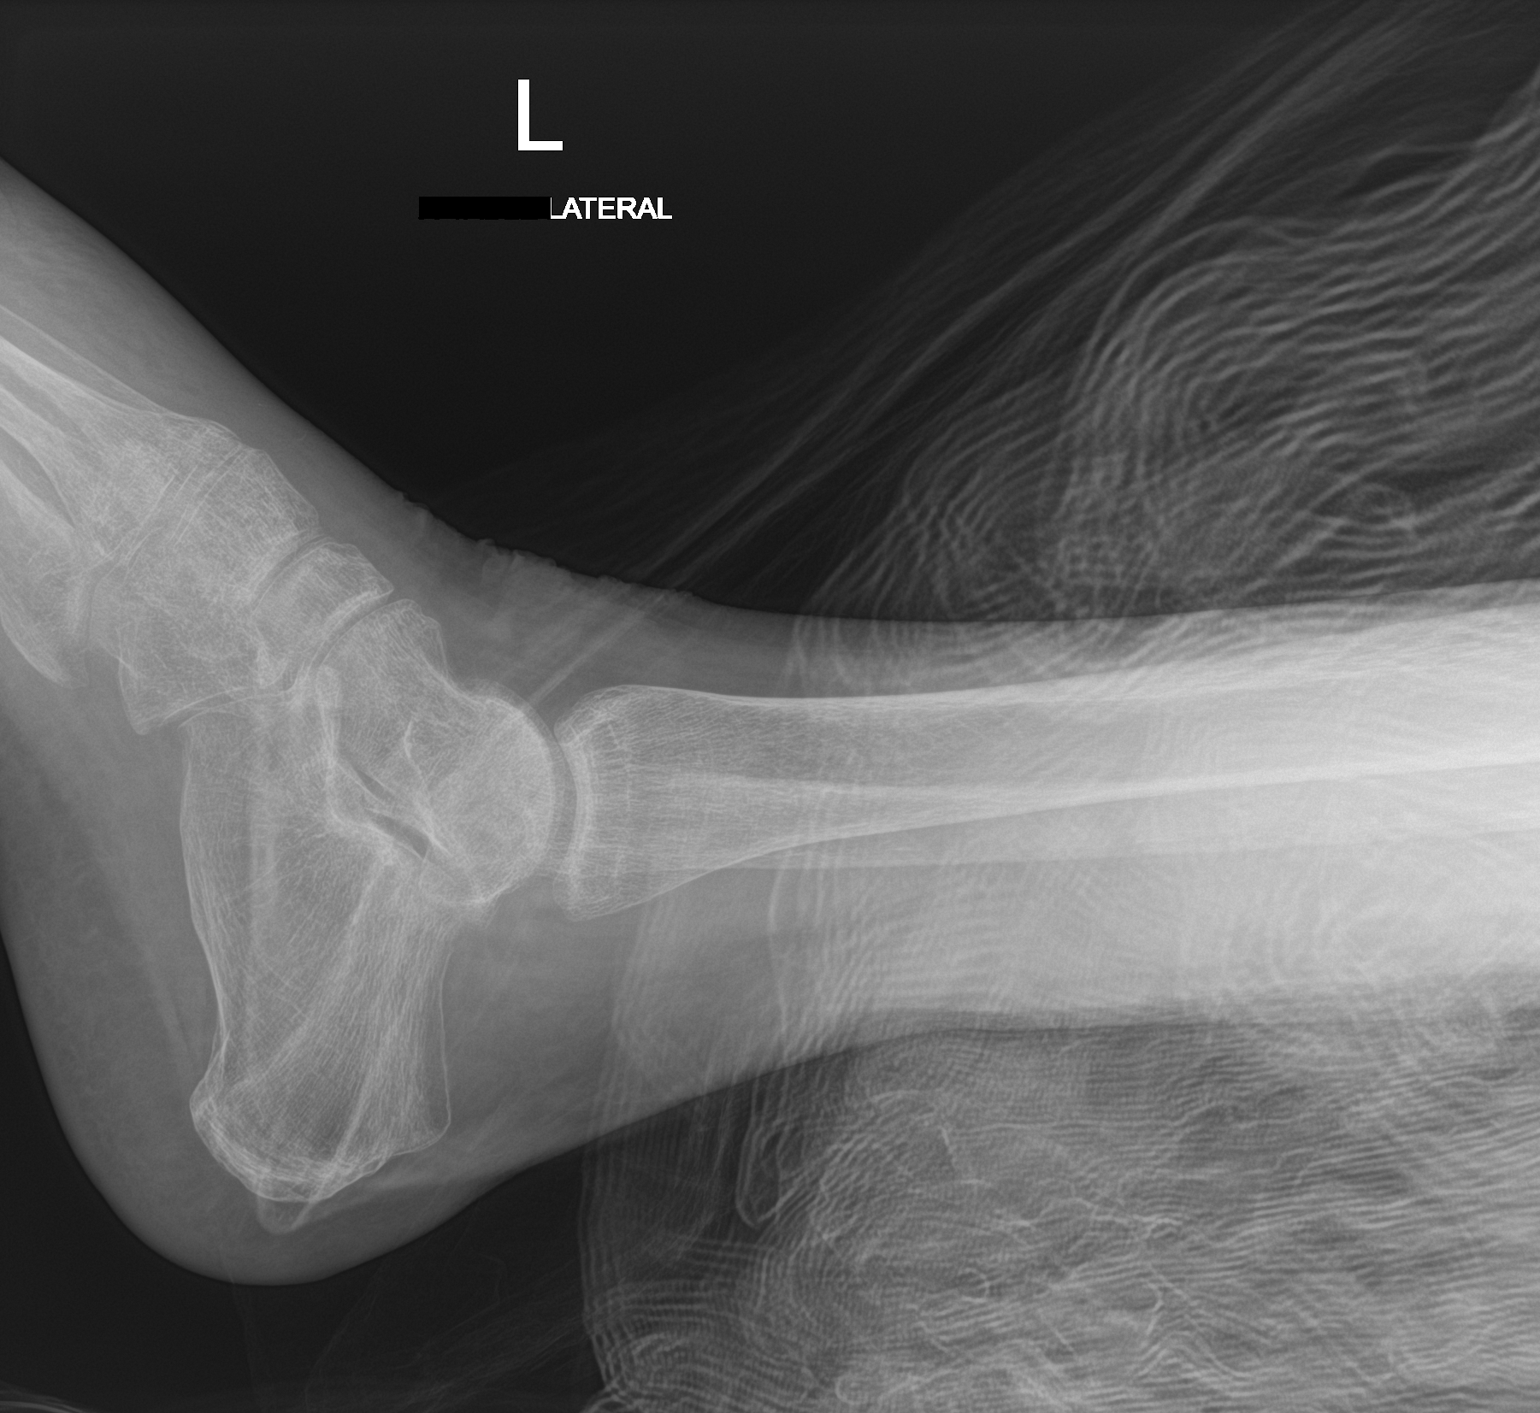

[3 of 3 positions shown; findings below may reference images not displayed]

FINDINGS: Diffusely decreased bone density. There is no evidence of fracture
or dislocation. No cortical erosion or destruction. There is no
evidence of arthropathy or other focal bone abnormality. Diffuse
subcu soft tissue edema of the left ankle and partially visualized
dorsum of the left foot.
IMPRESSION: No acute fracture or dislocation of the left ankle.

No radiographic findings to suggest osteomyelitis of the left
ankle.

## 2022-05-12 ENCOUNTER — Telehealth: Payer: Self-pay | Admitting: *Deleted

## 2022-05-12 NOTE — Telephone Encounter (Signed)
Patient's son is calling to let us know that patient has a swollen left foot and shooting pain , has been going on for over a year. 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

## 2022-05-14 ENCOUNTER — Ambulatory Visit (INDEPENDENT_AMBULATORY_CARE_PROVIDER_SITE_OTHER): Payer: Medicare PPO

## 2022-05-14 ENCOUNTER — Ambulatory Visit: Payer: Medicare PPO | Admitting: Podiatry

## 2022-05-14 DIAGNOSIS — M25572 Pain in left ankle and joints of left foot: Secondary | ICD-10-CM | POA: Diagnosis not present

## 2022-05-14 DIAGNOSIS — R600 Localized edema: Secondary | ICD-10-CM

## 2022-05-14 DIAGNOSIS — M21372 Foot drop, left foot: Secondary | ICD-10-CM | POA: Diagnosis not present

## 2022-05-14 MED ORDER — METHYLPREDNISOLONE 4 MG PO TBPK: ORAL_TABLET | ORAL | 0 refills | Status: AC

## 2022-05-14 MED ORDER — MELOXICAM 7.5 MG PO TABS: 7.5000 mg | ORAL_TABLET | Freq: Every day | ORAL | 0 refills | Status: AC

## 2022-05-14 NOTE — Progress Notes (Signed)
Subjective:  Patient ID: Samantha Duncan, female    DOB: 1955/10/08,  MRN: [id number]  Chief Complaint  Patient presents with   Ankle Pain    ankle swollen for 2 years and now has shooting pain. with fluid, pain increases when pressure is applied.    66 y.o. female presents with concern for chronic ankle pain and swelling to the left ankle for approximately 2 years now.  She has had increased swelling recently.  Does not have any pain in the calf but does have chronic swelling.  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  She has tried wearing compression stockings in the past but has not been effective in reducing the swelling additionally she has had heel and ankle ulcerations develop from too tight of the Ace wrap that was applied for swelling to her left side in the past.  She does have a history of having a more recent hip injury as well as a ankle sprain.  Believes that all of her symptoms are coming from the rain.  Wanted to get the ankle checked out.  She is presenting today with her son.  Past Medical History:  Diagnosis Date   Stroke (HCC)     Allergies  Allergen Reactions   Sulfa Antibiotics Other (See Comments)    Reaction unknown    ROS: Negative except as per HPI above  Objective:  General: AAO x3, NAD  Dermatological: With inspection and palpation of the right and left lower extremities there are no open sores, no preulcerative lesions, no rash or signs of infection present. Nails are of normal length thickness and coloration.   Vascular:  Dorsalis Pedis artery and Posterior Tibial artery pedal pulses are 2/4 bilateral.  Capillary fill time < 3 sec to all digits.  There is 2+ pitting edema noted to the left ankle and foot  Neruologic: Grossly intact via light touch bilateral. Protective threshold intact to all sites  bilateral.   Musculoskeletal: Decreased ankle range of motion and decreased ankle strength loss of ankle dorsiflexion.  There is no acute pain with palpation of the left ankle.  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  Radiographs:  Date: 05/14/2022 XR the left foot Weightbearing AP/Lateral/Oblique   Findings: There is noted to be grossly diminished bone mineralization present on these radiographs with increased radiolucency noted of the left foot.  No acute fracture is identified Assessment:   1. Acute left ankle pain   2. Leg edema, left   3. Left foot drop      Plan:  Patient was evaluated and treated and all questions answered.  # Chronic left lower extremity edema and left foot and ankle pain when ambulating -Believe that she is experiencing chronic pain related to her prior ankle sprain. -Recommend we try a course of methylprednisolone 4 mg steroid taper pack take as directed for 6 days -Recommend meloxicam 7.5 mg daily for pain and swelling for 3 days. -Discussed the risk and benefits of these medications.  # Chronic left lower extremity edema -Referral placed to vascular surgery vein clinic to evaluate further for chronic venous insufficiency determine if anything can be done other than compression stocking for her chronic swelling -Also discussed she should further discuss the chronic edema with her  primary care doctor she may have other medical issues that can be contributing to chronic lower extremity edema  # Use of AFO for foot drop left lower extremity -Patient was provided a prescription to [facility] for fabrication of a custom shoe to accommodate her AFO device. -She has not been completely compliant with wearing the AFO device but I want her to use this daily at this time when ambulating.   Return in about 6 weeks (around 06/25/2022) for Follow up L leg edema.          Corinna Gab,  DPM [facility]

## 2022-05-21 ENCOUNTER — Ambulatory Visit: Payer: Medicare PPO | Admitting: Podiatry

## 2022-06-10 ENCOUNTER — Telehealth: Payer: Self-pay | Admitting: Podiatry

## 2022-06-10 NOTE — Telephone Encounter (Signed)
FYI Pts son called and they still have not heard from vein and vascular office.He left message with nurse at our office last week and has not heard from [place] either. I see the referral in there and gave pts son the number to vascular and vein to call to see if he can get the appt scheduled. I told him to let us know if any issues.

## 2022-06-11 ENCOUNTER — Other Ambulatory Visit: Payer: Self-pay | Admitting: *Deleted

## 2022-06-11 ENCOUNTER — Ambulatory Visit (HOSPITAL_COMMUNITY)
Admission: RE | Admit: 2022-06-11 | Discharge: 2022-06-11 | Disposition: A | Discharge status: Home / Self Care | Payer: Medicare PPO | Source: Ambulatory Visit | Attending: Vascular Surgery | Admitting: Vascular Surgery

## 2022-06-11 DIAGNOSIS — R600 Localized edema: Secondary | ICD-10-CM

## 2022-06-11 NOTE — Telephone Encounter (Signed)
Noted, thanks!

## 2022-06-11 NOTE — Telephone Encounter (Signed)
Patient was scheduled yesterday for Vein and Vascular per appt notes from [facility]

## 2022-06-12 ENCOUNTER — Encounter: Payer: Self-pay | Admitting: Vascular Surgery

## 2022-06-12 ENCOUNTER — Ambulatory Visit: Payer: Medicare PPO | Admitting: Vascular Surgery

## 2022-06-12 VITALS — BP 145/78 | HR 89 | Temp 98.1°F | Resp 20 | Ht 61.0 in | Wt 118.0 lb

## 2022-06-12 DIAGNOSIS — I890 Lymphedema, not elsewhere classified: Secondary | ICD-10-CM

## 2022-06-12 DIAGNOSIS — M7989 Other specified soft tissue disorders: Secondary | ICD-10-CM

## 2022-06-12 NOTE — Progress Notes (Signed)
Office Note     CC: Left foot lymphedema Requesting Provider:  Yevonne Pax*  HPI: Samantha Duncan is a 67 y.o. (1956/04/16) female presenting at the request of .Lorene Dy, MD with left foot lymphedema.  On exam today, Samantha Duncan was doing well, accompanied by her son.  Samantha Duncan has previous history of ruptured cerebral aneurysm, stroke which has affected the left side of her body.  This occurred in 1989.  Since that time, she has battled back to regain independence and ambulate.  Over the last several years, she has appreciated significant edema in the left foot, specifically on the dorsal aspect.  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  Several years ago, Samantha Duncan with also had wounds on the heel and dorsal aspect of the left foot due to an Ace wrap that was too tight.  This took considerable time to heal.  Samantha Duncan denies symptoms of claudication, rest pain, lower extremity ulceration.   Past Medical History:  Diagnosis Date   Stroke [facility])     Past Surgical History:  Procedure Laterality Date   BRAIN SURGERY      Social History   Socioeconomic History   Marital status: Divorced    Spouse name: Not on file   Number of children: Not on file   Years of education: Not on file   Highest education level: Not on file  Occupational History   Not on file  Tobacco Use   Smoking status: Former    Types: Cigarettes    Quit date: 01/10/2020    Years since quitting: 2.4   Smokeless tobacco: Never  Vaping Use   Vaping Use: Never used  Substance and Sexual Activity   Alcohol use: Not Currently   Drug use: Not Currently   Sexual activity: Not on file  Other Topics Concern   Not on file  Social History Narrative   Not on file   Social Determinants of Health   Financial Resource Strain: Not on file  Food Insecurity: Not on file  Transportation Needs: Not on file  Physical Activity: Not on file  Stress: Not on file  Social Connections:  Not on file  Intimate Partner Violence: Not on file  History reviewed. No pertinent family history.  Current Outpatient Medications  Medication Sig Dispense Refill   amLODipine (NORVASC) 10 MG tablet Take 10 mg by mouth at bedtime.      Calcium-Magnesium (CAL/MAG) 200-100 MG TABS Take 1 tablet by mouth daily.     Cholecalciferol 25 MCG (1000 UT) tablet Take 1,000 Units by mouth daily.     clorazepate (TRANXENE) 7.5 MG tablet Take 7.5 mg by mouth 2 (two) times daily.     co-enzyme Q-10 30 MG capsule Take 30 mg by mouth daily.     Ensure (ENSURE) Take 237 mLs by mouth 2 (two) times daily between meals.     HYDROcodone-acetaminophen (NORCO/VICODIN) 5-325 MG tablet Take 1 tablet by mouth See admin instructions. 1 tablet three times daily And 1 extra tablet daily as needed for pain     losartan (COZAAR) 50 MG tablet Take 50 mg by mouth at bedtime.      meloxicam (MOBIC) 7.5 MG tablet Take 1 tablet (7.5 mg total) by mouth daily. 30 tablet 0   methylPREDNISolone (MEDROL DOSEPAK) 4 MG TBPK tablet Take as directed for 6 days 1 each 0   Multiple Vitamin (MULTIVITAMIN WITH MINERALS) TABS tablet Take 1 tablet by mouth daily.  rosuvastatin (CRESTOR) 10 MG tablet Take 10 mg by mouth 2 (two) times a week. TUE & THUR     tiZANidine (ZANAFLEX) 4 MG tablet Take 4 mg by mouth 2 (two) times daily.     traZODone (DESYREL) 100 MG tablet Take 100 mg by mouth at bedtime.      No current facility-administered medications for this visit.    Allergies  Allergen Reactions   Sulfa Antibiotics Other (See Comments)    Reaction unknown     REVIEW OF SYSTEMS:  [X]  denotes positive finding, [ ]  denotes negative finding Cardiac  Comments:  Chest pain or chest pressure:    Shortness of breath upon exertion:    Short of breath when lying flat:    Irregular heart rhythm:        Vascular    Pain in calf, thigh, or hip brought on by ambulation:    Pain in feet at night that wakes you up from your sleep:      Blood clot in your veins:    Leg swelling:         Pulmonary    Oxygen at home:    Productive cough:     Wheezing:         Neurologic    Sudden weakness in arms or legs:     Sudden numbness in arms or legs:     Sudden onset of difficulty speaking or slurred speech:    Temporary loss of vision in one eye:     Problems with dizziness:         Gastrointestinal    Blood in stool:     Vomited blood:         Genitourinary    Burning when urinating:     Blood in urine:        Psychiatric    Major depression:         Hematologic    Bleeding problems:    Problems with blood clotting too easily:        Skin    Rashes or ulcers:        Constitutional    Fever or chills:      PHYSICAL EXAMINATION:  Vitals:   06/12/22 1511  BP: (!) 145/78  Pulse: 89  Resp: 20  Temp: 98.1 F (36.7 C)  SpO2: 94%  Weight: 118 lb (53.5 kg)  Height: 5\' 1"  (1.549 m)    General:  WDWN in NAD; vital signs documented above Gait: Not observed HENT: WNL, normocephalic Pulmonary: normal non-labored breathing , without wheezing Cardiac: regular HR Abdomen: soft, NT, no masses Skin: without rashes Vascular Exam/Pulses:  Right Left  Radial 2+ (normal) 2+ (normal)  Ulnar    Femoral    Popliteal    DP 2+ (normal) 2+ (normal)  PT     Extremities: without ischemic changes, without Gangrene , without cellulitis; without open wounds;  Buff[place] hump, significant edema on the dorsal aspect of the left foot. Musculoskeletal: no muscle wasting or atrophy  Neurologic: A&O X 3;  No focal weakness or paresthesias are detected Psychiatric:  The pt has Normal affect.   Non-Invasive Vascular Imaging:   +--------------+---------+------+-----------+------------+--------+  LEFT         Reflux NoRefluxReflux TimeDiameter cmsComments                          Yes                                   +--------------+---------+------+-----------+------------+--------+  CFV          no                                               +--------------+---------+------+-----------+------------+--------+  FV prox       no                                              +--------------+---------+------+-----------+------------+--------+  FV mid        no                                              +--------------+---------+------+-----------+------------+--------+  FV dist       no                                              +--------------+---------+------+-----------+------------+--------+  Popliteal    no                                              +--------------+---------+------+-----------+------------+--------+  GSV at SFJ              yes    >500 ms      0.68              +--------------+---------+------+-----------+------------+--------+  GSV prox thighno                            0.43              +--------------+---------+------+-----------+------------+--------+  GSV mid thigh no                            0.22              +--------------+---------+------+-----------+------------+--------+  GSV dist thighno                            0.39              +--------------+---------+------+-----------+------------+--------+  GSV at knee   no                            0.25              +--------------+---------+------+-----------+------------+--------+  GSV prox calf no                            0.22              +--------------+---------+------+-----------+------------+--------+  SSV Pop Fossa                                       NWV       +--------------+---------+------+-----------+------------+--------+  SSV prox calf                                       NWV       +--------------+---------+------+-----------+------------+--------+      ASSESSMENT/PLAN: Samantha Duncan is a 67 y.o. female presenting with localized edema in the left forefoot consistent with lymphedema.  This  lymphedema is a product of prior stroke, decreased motor, prior iatrogenic injury. Lower extremity venous insufficiency duplex ultrsound demonstrated no significant venous insufficiency in the left leg.  I had a long discussion with Samantha Duncan regarding the above.  She would benefit from lower extremity compression stockings in an effort to reduce the edema in the foot.  Fortunately, the forefoot remains soft, edema is mobile, no fibrosis appreciated.  Should edema worsen, involve the calf, my plan would be to send her to the [facility] lymphedema clinic for further evaluation.  Being that this has been stable for quite some time, I think she will have significant improvement with the use of compression stockings and elevation.    Broadus John, MD [facility] [phone]

## 2022-06-22 ENCOUNTER — Other Ambulatory Visit: Payer: Self-pay | Admitting: Podiatry

## 2022-06-22 DIAGNOSIS — M21372 Foot drop, left foot: Secondary | ICD-10-CM

## 2022-06-22 DIAGNOSIS — M25572 Pain in left ankle and joints of left foot: Secondary | ICD-10-CM

## 2022-06-22 DIAGNOSIS — R600 Localized edema: Secondary | ICD-10-CM

## 2022-06-29 ENCOUNTER — Telehealth: Payer: Self-pay | Admitting: Podiatry

## 2022-06-29 NOTE — Telephone Encounter (Signed)
Patient lost prescription for AFO for [facility] can you refax it over to them ASAP

## 2022-06-29 NOTE — Telephone Encounter (Signed)
Fax number for [facility] is [phone]

## 2022-07-09 ENCOUNTER — Ambulatory Visit: Payer: Medicare PPO | Admitting: Podiatry
